# Patient Record
Sex: Female | Born: 1990 | Race: Black or African American | Hispanic: No | Marital: Single | Smoking: Never smoker
Health system: Southern US, Community
[De-identification: ages and names within clinical notes are randomized; demographics above are authoritative.]

---

## 2009-11-25 ENCOUNTER — Inpatient Hospital Stay (HOSPITAL_COMMUNITY): Admission: AD | Admit: 2009-11-25 | Discharge: 2009-11-25 | Payer: Self-pay | Admitting: Obstetrics and Gynecology

## 2009-11-26 ENCOUNTER — Inpatient Hospital Stay (HOSPITAL_COMMUNITY): Admission: AD | Admit: 2009-11-26 | Discharge: 2009-11-28 | Payer: Self-pay | Admitting: Obstetrics and Gynecology

## 2011-01-15 LAB — CBC
Hemoglobin: 11 g/dL — ABNORMAL LOW (ref 12.0–15.0)
Platelets: 287 10*3/uL (ref 150–400)
RBC: 3.47 MIL/uL — ABNORMAL LOW (ref 3.87–5.11)
RBC: 4.23 MIL/uL (ref 3.87–5.11)
WBC: 13.2 10*3/uL — ABNORMAL HIGH (ref 4.0–10.5)
WBC: 8.3 10*3/uL (ref 4.0–10.5)

## 2011-01-15 LAB — RPR: RPR Ser Ql: NONREACTIVE

## 2014-08-29 ENCOUNTER — Encounter (HOSPITAL_BASED_OUTPATIENT_CLINIC_OR_DEPARTMENT_OTHER): Payer: Self-pay

## 2014-08-29 ENCOUNTER — Emergency Department (HOSPITAL_BASED_OUTPATIENT_CLINIC_OR_DEPARTMENT_OTHER)
Admission: EM | Admit: 2014-08-29 | Discharge: 2014-08-29 | Disposition: A | Discharge status: Home / Self Care | Payer: 59 | Attending: Emergency Medicine | Admitting: Emergency Medicine

## 2014-08-29 DIAGNOSIS — J029 Acute pharyngitis, unspecified: Secondary | ICD-10-CM | POA: Diagnosis not present

## 2014-08-29 DIAGNOSIS — J020 Streptococcal pharyngitis: Secondary | ICD-10-CM

## 2014-08-29 LAB — RAPID STREP SCREEN (MED CTR MEBANE ONLY): STREPTOCOCCUS, GROUP A SCREEN (DIRECT): POSITIVE — AB

## 2014-08-29 MED ORDER — PENICILLIN G BENZATHINE & PROC 1200000 UNIT/2ML IM SUSP
INTRAMUSCULAR | Status: AC
  Administered 2014-08-29: 13:00:00
  Filled 2014-08-29: qty 2

## 2014-08-29 MED ORDER — HYDROCODONE-ACETAMINOPHEN 5-325 MG PO TABS
1.0000 | ORAL_TABLET | Freq: Four times a day (QID) | ORAL | Status: DC | PRN
Start: 2014-08-29 — End: 2015-08-17

## 2014-08-29 MED ORDER — PENICILLIN G BENZATHINE & PROC 900000-300000 UNIT/2ML IM SUSP
1.2000 10*6.[IU] | Freq: Once | INTRAMUSCULAR | Status: DC
  Filled 2014-08-29: qty 2

## 2014-08-29 NOTE — ED Notes (Signed)
Patient here with swelling and pain to left side of throat intermittently x 2 weeks, no other associated symptoms

## 2014-08-29 NOTE — ED Notes (Signed)
Unable to pull penicillin g benzathine-penicilln. Hope PA aware Bicillin CR 600/600 ordered

## 2014-08-29 NOTE — ED Provider Notes (Signed)
CSN: [id number]     Arrival date & time 08/29/14  [id number] History   First MD Initiated Contact with Patient 08/29/14 1159     Chief Complaint  Patient presents with   Sore Throat     (Consider location/radiation/quality/duration/timing/severity/associated sxs/prior Treatment) Patient is a 23 y.o. female presenting with pharyngitis. The history is provided by the patient.  Sore Throat This is a new problem. The current episode started 1 to 4 weeks ago. The problem occurs constantly. The problem has been gradually worsening. Associated symptoms include chills, coughing, a sore throat and swollen glands. The symptoms are aggravated by swallowing. Linda Norman has tried nothing for the symptoms.   Linda Norman is a 23 y.o. female who presents to the ED with sore throat that started over a week ago. The pain has gotten worse with swollen tonsil on the left and and gland swelling  History reviewed. No pertinent past medical history. History reviewed. No pertinent past surgical history. No family history on file. History  Substance Use Topics   Smoking status: Never Smoker    Smokeless tobacco: Not on file   Alcohol Use: Not on file   OB History    No data available     Review of Systems  Constitutional: Positive for chills.  HENT: Positive for sore throat.   Respiratory: Positive for cough.   All other systems negative    Allergies  Review of patient's allergies indicates no known allergies.  Home Medications   Prior to Admission medications   Not on File   BP 143/83 mmHg   Pulse 91   Temp(Src) 98 F (36.7 C) (Oral)   Resp 18   Wt 160 lb (72.576 kg)   SpO2 100%   LMP 08/29/2014 Physical Exam  Constitutional: Linda Norman is oriented to person, place, and time. Linda Norman appears well-developed and well-nourished. No distress.  HENT:  Head: Normocephalic.  Right Ear: Tympanic membrane normal.  Left Ear: Tympanic membrane normal.  Nose: Nose normal.  Mouth/Throat: Uvula is midline.  Oropharyngeal exudate and posterior oropharyngeal erythema present.  Eyes: Conjunctivae and EOM are normal. Pupils are equal, round, and reactive to light.  Neck: Normal range of motion. Neck supple.  Cardiovascular: Normal rate and regular rhythm.   Pulmonary/Chest: Effort normal and breath sounds normal.  Abdominal: Bowel sounds are normal. There is no tenderness.  Lymphadenopathy:    Linda Norman has cervical adenopathy (left).  Neurological: Linda Norman is alert and oriented to person, place, and time.  Skin: Skin is warm and dry.  Psychiatric: Linda Norman has a normal mood and affect. Her behavior is normal.    ED Course  Procedures (including critical care time) Labs Review Labs Reviewed  RAPID STREP SCREEN - Abnormal; Notable for the following:    Streptococcus, Group A Screen (Direct) POSITIVE (*)    All other components within normal limits    MDM  23 y.o. female with sore throat and gland swelling. Patient request injection of penicillin rather than medication PO. Will administer Bicillin 1.2 MU IM prior to d/c. Stable for discharge without difficulty swallowing. Will treat pain. Discussed with the patient and all questioned fully answered. Linda Norman will return if any problems arise.

## 2015-08-17 ENCOUNTER — Emergency Department (HOSPITAL_BASED_OUTPATIENT_CLINIC_OR_DEPARTMENT_OTHER): Payer: 59

## 2015-08-17 ENCOUNTER — Emergency Department (HOSPITAL_BASED_OUTPATIENT_CLINIC_OR_DEPARTMENT_OTHER)
Admission: EM | Admit: 2015-08-17 | Discharge: 2015-08-17 | Disposition: A | Discharge status: Home / Self Care | Payer: 59 | Attending: Emergency Medicine | Admitting: Emergency Medicine

## 2015-08-17 ENCOUNTER — Encounter (HOSPITAL_BASED_OUTPATIENT_CLINIC_OR_DEPARTMENT_OTHER): Payer: Self-pay | Admitting: *Deleted

## 2015-08-17 DIAGNOSIS — R55 Syncope and collapse: Secondary | ICD-10-CM | POA: Diagnosis not present

## 2015-08-17 DIAGNOSIS — R42 Dizziness and giddiness: Secondary | ICD-10-CM | POA: Diagnosis not present

## 2015-08-17 DIAGNOSIS — R112 Nausea with vomiting, unspecified: Secondary | ICD-10-CM | POA: Insufficient documentation

## 2015-08-17 DIAGNOSIS — R0602 Shortness of breath: Secondary | ICD-10-CM | POA: Diagnosis not present

## 2015-08-17 DIAGNOSIS — O9989 Other specified diseases and conditions complicating pregnancy, childbirth and the puerperium: Secondary | ICD-10-CM | POA: Insufficient documentation

## 2015-08-17 DIAGNOSIS — Z3490 Encounter for supervision of normal pregnancy, unspecified, unspecified trimester: Secondary | ICD-10-CM

## 2015-08-17 DIAGNOSIS — Z3A13 13 weeks gestation of pregnancy: Secondary | ICD-10-CM | POA: Diagnosis not present

## 2015-08-17 LAB — CBC WITH DIFFERENTIAL/PLATELET
Basophils Absolute: 0 10*3/uL (ref 0.0–0.1)
Basophils Relative: 0 %
EOS ABS: 0 10*3/uL (ref 0.0–0.7)
EOS PCT: 0 %
HCT: 39.3 % (ref 36.0–46.0)
Hemoglobin: 13.6 g/dL (ref 12.0–15.0)
LYMPHS ABS: 0.9 10*3/uL (ref 0.7–4.0)
LYMPHS PCT: 10 %
MCH: 31.2 pg (ref 26.0–34.0)
MCHC: 34.6 g/dL (ref 30.0–36.0)
MCV: 90.1 fL (ref 78.0–100.0)
MONO ABS: 0.5 10*3/uL (ref 0.1–1.0)
MONOS PCT: 6 %
Neutro Abs: 7.6 10*3/uL (ref 1.7–7.7)
Neutrophils Relative %: 84 %
PLATELETS: 285 10*3/uL (ref 150–400)
RBC: 4.36 MIL/uL (ref 3.87–5.11)
RDW: 13 % (ref 11.5–15.5)
WBC: 9.1 10*3/uL (ref 4.0–10.5)

## 2015-08-17 LAB — BASIC METABOLIC PANEL
Anion gap: 6 (ref 5–15)
BUN: 5 mg/dL — AB (ref 6–20)
CO2: 24 mmol/L (ref 22–32)
CREATININE: 0.54 mg/dL (ref 0.44–1.00)
Calcium: 8.8 mg/dL — ABNORMAL LOW (ref 8.9–10.3)
Chloride: 105 mmol/L (ref 101–111)
GFR calc Af Amer: >60 mL/min (ref >60)
GLUCOSE: 90 mg/dL (ref 65–99)
POTASSIUM: 3.4 mmol/L — AB (ref 3.5–5.1)
Sodium: 135 mmol/L (ref 135–145)

## 2015-08-17 LAB — HCG, QUANTITATIVE, PREGNANCY: hCG, Beta Chain, Quant, S: 45291 m[IU]/mL — ABNORMAL HIGH (ref <5)

## 2015-08-17 LAB — URINALYSIS, ROUTINE W REFLEX MICROSCOPIC
GLUCOSE, UA: NEGATIVE mg/dL
HGB URINE DIPSTICK: NEGATIVE
KETONES UR: NEGATIVE mg/dL
Leukocytes, UA: NEGATIVE
Nitrite: NEGATIVE
PH: 6 (ref 5.0–8.0)
Protein, ur: NEGATIVE mg/dL
SPECIFIC GRAVITY, URINE: 1.024 (ref 1.005–1.030)
Urobilinogen, UA: 1 mg/dL (ref 0.0–1.0)

## 2015-08-17 LAB — PREGNANCY, URINE: Preg Test, Ur: POSITIVE — AB

## 2015-08-17 NOTE — ED Notes (Signed)
Pt ambulated around nurses station x 1. Cont pulse ox on pt during ambulation. Pt hrt rate remained in upper 80's and O2 sats 98-100%. Denies any sx or SOB while walking.

## 2015-08-17 NOTE — ED Provider Notes (Signed)
CSN: [id number]     Arrival date & time 08/17/15  0758 History   First MD Initiated Contact with Patient 08/17/15 [phone]     No chief complaint on file.    (Consider location/radiation/quality/duration/timing/severity/associated sxs/prior Treatment) HPI Comments: Patient complains of "panic attack" while getting off the bus this morning. She endorsed quivering lips, shortness of breath, pain with inspiration, dizziness and lightheadedness for several minutes. This resolved after she rested in the restroom coming up against the wall. She feels back to baseline now. No history of previous episodes that are similar. No history of panic attacks. She denies any cough, fever, abdominal pain. She has had a few episodes of nausea and vomiting this morning because she is approximately 3 months pregnant last menstrual period in July. She's not had any vaginal bleeding or abdominal pain. She has not had an ultrasound this pregnancy. She denies any previous episodes of dizziness or lightheadedness. No fever. Good by mouth intake and urine output. Denies any other medical problems or any medications. No previous diagnosis of panic attacks.  The history is provided by the patient.    History reviewed. No pertinent past medical history. History reviewed. No pertinent past surgical history. No family history on file. Social History  Substance Use Topics  . Smoking status: Never Smoker   . Smokeless tobacco: None  . Alcohol Use: None   OB History    Gravida Para Term Preterm AB TAB SAB Ectopic Multiple Living   1              Review of Systems  Constitutional: Negative for fever, activity change and appetite change.  HENT: Negative for congestion and rhinorrhea.   Respiratory: Positive for shortness of breath. Negative for cough and chest tightness.   Cardiovascular: Negative for chest pain.  Gastrointestinal: Positive for nausea and vomiting. Negative for abdominal pain.  Genitourinary: Negative for  dysuria, hematuria, vaginal bleeding and vaginal discharge.  Musculoskeletal: Negative for myalgias and arthralgias.  Neurological: Positive for dizziness, weakness and light-headedness. Negative for headaches.  A complete 10 system review of systems was obtained and all systems are negative except as noted in the HPI and PMH.      Allergies  Review of patient's allergies indicates no known allergies.  Home Medications   Prior to Admission medications   Not on File   BP 118/55 mmHg  Pulse 84  Temp(Src) 98.7 F (37.1 C) (Oral)  Resp 18  Ht 5\' 7"  (1.702 m)  Wt 165 lb (74.844 kg)  BMI 25.84 kg/m2  SpO2 99%  LMP 05/18/2015 Physical Exam  Constitutional: She is oriented to person, place, and time. She appears well-developed and well-nourished. No distress.  HENT:  Head: Normocephalic and atraumatic.  Mouth/Throat: Oropharynx is clear and moist. No oropharyngeal exudate.  Eyes: Conjunctivae and EOM are normal. Pupils are equal, round, and reactive to light.  Neck: Normal range of motion. Neck supple.  No meningismus.  Cardiovascular: Normal rate, regular rhythm, normal heart sounds and intact distal pulses.   No murmur heard. Pulmonary/Chest: Effort normal and breath sounds normal. No respiratory distress. She exhibits no tenderness.  Abdominal: Soft. There is no tenderness. There is no rebound and no guarding.  Gravid abdomen, nontender  Musculoskeletal: Normal range of motion. She exhibits no edema or tenderness.  Neurological: She is alert and oriented to person, place, and time. No cranial nerve deficit. She exhibits normal muscle tone. Coordination normal.  No ataxia on finger to nose bilaterally. No pronator drift.  5/5 strength throughout. CN 2-12 intact. Negative Romberg. Equal grip strength. Sensation intact. Gait is normal.   Skin: Skin is warm.  Psychiatric: She has a normal mood and affect. Her behavior is normal.  Nursing note and vitals reviewed.   ED Course   Procedures (including critical care time) Labs Review Labs Reviewed  URINALYSIS, ROUTINE W REFLEX MICROSCOPIC (NOT AT [facility]) - Abnormal; Notable for the following:    Color, Urine AMBER (*)    APPearance CLOUDY (*)    Bilirubin Urine SMALL (*)    All other components within normal limits  PREGNANCY, URINE - Abnormal; Notable for the following:    Preg Test, Ur POSITIVE (*)    All other components within normal limits  BASIC METABOLIC PANEL - Abnormal; Notable for the following:    Potassium 3.4 (*)    BUN 5 (*)    Calcium 8.8 (*)    All other components within normal limits  HCG, QUANTITATIVE, PREGNANCY - Abnormal; Notable for the following:    hCG, Beta Chain, Quant, S 45291 (*)    All other components within normal limits  CBC WITH DIFFERENTIAL/PLATELET    Imaging Review US Ob Comp Less 14 Wks  08/17/2015  CLINICAL DATA:  Syncope. Dizziness and lightheadedness this morning. Early pregnancy. EXAM: OBSTETRIC <14 WK ULTRASOUND TECHNIQUE: Transabdominal ultrasound was performed for evaluation of the gestation as well as the maternal uterus and adnexal regions. COMPARISON:  None. FINDINGS: Intrauterine gestational sac: Visualized/normal in shape. Yolk sac:  No longer visualized. Embryo:  Present. Cardiac Activity: Present. Heart Rate: 152 bpm CRL:   75  mm   13 w 4 d                  [place] EDC: 02/18/2016 Maternal uterus/adnexae: The ovaries not identified. No free fluid is seen in the pelvis. IMPRESSION: Single living intrauterine gestation as above. Gestational age [redacted] weeks 4 days by ultrasound. Electronically Signed   By: Sebastian Ache M.D.   On: 08/17/2015 09:41   I have personally reviewed and evaluated these images and lab results as part of my medical decision-making.   EKG Interpretation None      MDM   Final diagnoses:  Near syncope  Pregnant   Episode of dizziness with shortness of breath, chest tightness, quivering lips, nausea. Approximately [redacted] weeks pregnant with no  prenatal care. Episode now resolved. No abdominal pain or vaginal bleeding. No previous history of panic attacks.  EKG normal sinus. No hypoxia, no tachycardia. Doubt pulmonary embolism. Orthostatics negative.  Labs without anemia. Electrolytes stable. Ultrasound confirms IUP at [redacted] weeks gestation. No complicating features.  Tolerating by mouth and ambulatory. No hypoxia with ambulation. Patient feels better. No chest pain or shortness of breath. Discussed that my suspicion for pulmonary embolism is low, patient agrees and she defers CT angiogram at this time. Return precautions discussed.      ED ECG REPORT   Date: 08/17/2015  Rate: 85  Rhythm: normal sinus rhythm  QRS Axis: normal  Intervals: normal  ST/T Wave abnormalities: normal  Conduction Disutrbances:none  Narrative Interpretation:   Old EKG Reviewed: none available  I have personally reviewed the EKG tracing and agree with the computerized printout as noted.   Glynn Octave, MD 08/17/15 1536

## 2015-08-17 NOTE — Discharge Instructions (Signed)
Near-Syncope Keep yourself hydrated. Follow up with your obstetrician. Return to the ED if you develop chest pain, shortness of breath or any other concerns. Near-syncope (commonly known as near fainting) is sudden weakness, dizziness, or feeling like you might pass out. During an episode of near-syncope, you may also develop pale skin, have tunnel vision, or feel sick to your stomach (nauseous). Near-syncope may occur when getting up after sitting or while standing for a long time. It is caused by a sudden decrease in blood flow to the brain. This decrease can result from various causes or triggers, most of which are not serious. However, because near-syncope can sometimes be a sign of something serious, a medical evaluation is required. The specific cause is often not determined. HOME CARE INSTRUCTIONS  Monitor your condition for any changes. The following actions may help to alleviate any discomfort you are experiencing:  Have someone stay with you until you feel stable.  Lie down right away and prop your feet up if you start feeling like you might faint. Breathe deeply and steadily. Wait until all the symptoms have passed. Most of these episodes last only a few minutes. You may feel tired for several hours.   Drink enough fluids to keep your urine clear or pale yellow.   If you are taking blood pressure or heart medicine, get up slowly when seated or lying down. Take several minutes to sit and then stand. This can reduce dizziness.  Follow up with your health care provider as directed. SEEK IMMEDIATE MEDICAL CARE IF:   You have a severe headache.   You have unusual pain in the chest, abdomen, or back.   You are bleeding from the mouth or rectum, or you have black or tarry stool.   You have an irregular or very fast heartbeat.   You have repeated fainting or have seizure-like jerking during an episode.   You faint when sitting or lying down.   You have confusion.   You  have difficulty walking.   You have severe weakness.   You have vision problems.  MAKE SURE YOU:   Understand these instructions.  Will watch your condition.  Will get help right away if you are not doing well or get worse.   This information is not intended to replace advice given to you by your health care provider. Make sure you discuss any questions you have with your health care provider.   Document Released: 10/15/2005 Document Revised: 10/20/2013 Document Reviewed: 03/20/2013 Elsevier Interactive Patient Education [employer]2016 Elsevier Inc.

## 2015-08-17 NOTE — ED Notes (Signed)
Pt given cranberry juice and water at her request for po challenge.

## 2015-08-17 NOTE — ED Notes (Signed)
C/o sob and pain with breathing with dizziness. C/o left sided c/p with deep breaths. States feels like panic attack.

## 2015-08-23 LAB — OB RESULTS CONSOLE ABO/RH: RH TYPE: POSITIVE

## 2015-08-23 LAB — OB RESULTS CONSOLE ANTIBODY SCREEN: Antibody Screen: NEGATIVE

## 2015-08-23 LAB — OB RESULTS CONSOLE RPR: RPR: NONREACTIVE

## 2015-08-23 LAB — OB RESULTS CONSOLE HEPATITIS B SURFACE ANTIGEN: Hepatitis B Surface Ag: NEGATIVE

## 2015-08-23 LAB — OB RESULTS CONSOLE HIV ANTIBODY (ROUTINE TESTING): HIV: NONREACTIVE

## 2015-08-23 LAB — OB RESULTS CONSOLE RUBELLA ANTIBODY, IGM: Rubella: IMMUNE

## 2015-10-30 NOTE — L&D Delivery Note (Signed)
Final Labor Progress Note At 0757 pt reports an increased in rectal pressure. FHR remained reassuring   Vaginal Delivery Note The pt utilized an epidural as pain management.   Spontaneous rupture of membranes today, at 0715, clear.  GBS negative.  Cervical dilation was complete at  0804.  NICHD Category 2.    Pushing with guidance began at  0807.   After 8 minutes of pushing the head, shoulders and the body of a viable female infant "Linda Norman" delivered spontaneously with maternal effort EnCaul in the direct ROP position at 0815.  Loose Corson x 3, infant somersaulted thru without difficulty d/t rapid delivery.   With vigorous tone and spontaneous cry, the infant was placed on moms abd.  After the umbilical cord was clamped it was cut by the FOB, then cord blood was obtained for evaluation.  Spontaneous delivery of a intact placenta with a 3 vessel cord via Shultz at  [phone].   Episiotomy: None   The vulva, perineum, vaginal vault, rectum and cervix were inspected no repairs needed.  Postpartum pitocin as ordered.  Fundus firm, lochia minimum, bleeding under control. EBL 100, Pt hemodynamically stable.   Sponge, laps and needle count correct and verified with the primary care nurse.  Attending MD available at all times.    Routine postpartum orders   Mother unsure about method of contraception  Mom plans to breastfeed   Infant to have out patient circumcision   Placenta to pathology: NO     Cord Gases sent to lab: NO Cord blood sent to lab: YES   APGARS:  9 at 1 minute and 9 at 5 minutes Weight:. pending     Both mom and baby were left in stable condition, baby skin to skin.      Linda Norman, CNM, MSN 02/15/2016. 8:41 AM

## 2015-11-10 LAB — OB RESULTS CONSOLE GC/CHLAMYDIA
Chlamydia: NEGATIVE
Gonorrhea: NEGATIVE

## 2016-01-24 LAB — OB RESULTS CONSOLE GBS: STREP GROUP B AG: NEGATIVE

## 2016-02-15 ENCOUNTER — Inpatient Hospital Stay (HOSPITAL_COMMUNITY)
Admission: AD | Admit: 2016-02-15 | Discharge: 2016-02-16 | DRG: 775 | Disposition: A | Discharge status: Home / Self Care | Payer: Medicaid Other | Source: Ambulatory Visit | Attending: Obstetrics and Gynecology | Admitting: Obstetrics and Gynecology

## 2016-02-15 ENCOUNTER — Encounter (HOSPITAL_COMMUNITY): Payer: Self-pay

## 2016-02-15 ENCOUNTER — Inpatient Hospital Stay (HOSPITAL_COMMUNITY): Payer: Medicaid Other | Admitting: Anesthesiology

## 2016-02-15 DIAGNOSIS — O2243 Hemorrhoids in pregnancy, third trimester: Secondary | ICD-10-CM | POA: Diagnosis present

## 2016-02-15 DIAGNOSIS — Z3A39 39 weeks gestation of pregnancy: Secondary | ICD-10-CM

## 2016-02-15 LAB — CBC
HCT: 38.9 % (ref 36.0–46.0)
Hemoglobin: 13.8 g/dL (ref 12.0–15.0)
MCH: 32.2 pg (ref 26.0–34.0)
MCHC: 35.5 g/dL (ref 30.0–36.0)
MCV: 90.9 fL (ref 78.0–100.0)
PLATELETS: 263 10*3/uL (ref 150–400)
RBC: 4.28 MIL/uL (ref 3.87–5.11)
RDW: 13.7 % (ref 11.5–15.5)
WBC: 11.2 10*3/uL — ABNORMAL HIGH (ref 4.0–10.5)

## 2016-02-15 LAB — RPR: RPR: NONREACTIVE

## 2016-02-15 MED ORDER — DIPHENHYDRAMINE HCL 25 MG PO CAPS: 25.0000 mg | ORAL_CAPSULE | Freq: Four times a day (QID) | ORAL | Status: DC | PRN

## 2016-02-15 MED ORDER — OXYCODONE HCL 5 MG PO TABS
5.0000 mg | ORAL_TABLET | Freq: Once | ORAL | Status: AC
  Administered 2016-02-15: 5 mg via ORAL
  Filled 2016-02-15: qty 1

## 2016-02-15 MED ORDER — LIDOCAINE HCL (PF) 1 % IJ SOLN
INTRAMUSCULAR | Status: DC | PRN
Start: 2016-02-15 — End: 2016-02-15
  Administered 2016-02-15 (×2): 5 mL

## 2016-02-15 MED ORDER — ONDANSETRON HCL 4 MG/2ML IJ SOLN: 4.0000 mg | INTRAMUSCULAR | Status: DC | PRN

## 2016-02-15 MED ORDER — CITRIC ACID-SODIUM CITRATE 334-500 MG/5ML PO SOLN: 30.0000 mL | ORAL | Status: DC | PRN

## 2016-02-15 MED ORDER — EPHEDRINE 5 MG/ML INJ
10.0000 mg | INTRAVENOUS | Status: DC | PRN
  Filled 2016-02-15: qty 2

## 2016-02-15 MED ORDER — OXYCODONE-ACETAMINOPHEN 5-325 MG PO TABS: 2.0000 | ORAL_TABLET | ORAL | Status: DC | PRN

## 2016-02-15 MED ORDER — FENTANYL 2.5 MCG/ML BUPIVACAINE 1/10 % EPIDURAL INFUSION (WH - ANES)
14.0000 mL/h | INTRAMUSCULAR | Status: DC | PRN
  Administered 2016-02-15: 14 mL/h via EPIDURAL
  Filled 2016-02-15: qty 125

## 2016-02-15 MED ORDER — PHENYLEPHRINE 40 MCG/ML (10ML) SYRINGE FOR IV PUSH (FOR BLOOD PRESSURE SUPPORT)
80.0000 ug | PREFILLED_SYRINGE | INTRAVENOUS | Status: DC | PRN
  Filled 2016-02-15: qty 2
  Filled 2016-02-15: qty 20

## 2016-02-15 MED ORDER — WITCH HAZEL-GLYCERIN EX PADS: 1.0000 "application " | MEDICATED_PAD | CUTANEOUS | Status: DC | PRN

## 2016-02-15 MED ORDER — LIDOCAINE HCL (PF) 1 % IJ SOLN
30.0000 mL | INTRAMUSCULAR | Status: DC | PRN
  Filled 2016-02-15: qty 30

## 2016-02-15 MED ORDER — SIMETHICONE 80 MG PO CHEW: 80.0000 mg | CHEWABLE_TABLET | ORAL | Status: DC | PRN

## 2016-02-15 MED ORDER — FENTANYL CITRATE (PF) 100 MCG/2ML IJ SOLN: 100.0000 ug | Freq: Once | INTRAMUSCULAR | Status: DC

## 2016-02-15 MED ORDER — COCONUT OIL OIL: 1.0000 "application " | TOPICAL_OIL | Status: DC | PRN

## 2016-02-15 MED ORDER — FLEET ENEMA 7-19 GM/118ML RE ENEM: 1.0000 | ENEMA | RECTAL | Status: DC | PRN

## 2016-02-15 MED ORDER — DIBUCAINE 1 % RE OINT: 1.0000 "application " | TOPICAL_OINTMENT | RECTAL | Status: DC | PRN

## 2016-02-15 MED ORDER — OXYTOCIN BOLUS FROM INFUSION
500.0000 mL | INTRAVENOUS | Status: DC
  Administered 2016-02-15: 500 mL via INTRAVENOUS

## 2016-02-15 MED ORDER — ACETAMINOPHEN 325 MG PO TABS
650.0000 mg | ORAL_TABLET | ORAL | Status: DC | PRN
  Administered 2016-02-15 – 2016-02-16 (×2): 650 mg via ORAL
  Filled 2016-02-15 (×2): qty 2

## 2016-02-15 MED ORDER — ONDANSETRON HCL 4 MG/2ML IJ SOLN
4.0000 mg | Freq: Four times a day (QID) | INTRAMUSCULAR | Status: DC | PRN
  Administered 2016-02-15: 4 mg via INTRAVENOUS
  Filled 2016-02-15: qty 2

## 2016-02-15 MED ORDER — IBUPROFEN 600 MG PO TABS
600.0000 mg | ORAL_TABLET | Freq: Four times a day (QID) | ORAL | Status: DC
  Administered 2016-02-15 – 2016-02-16 (×4): 600 mg via ORAL
  Filled 2016-02-15 (×4): qty 1

## 2016-02-15 MED ORDER — LACTATED RINGERS IV SOLN
INTRAVENOUS | Status: DC
  Administered 2016-02-15: 05:00:00 via INTRAVENOUS

## 2016-02-15 MED ORDER — OXYCODONE-ACETAMINOPHEN 5-325 MG PO TABS: 1.0000 | ORAL_TABLET | ORAL | Status: DC | PRN

## 2016-02-15 MED ORDER — ZOLPIDEM TARTRATE 5 MG PO TABS: 5.0000 mg | ORAL_TABLET | Freq: Every evening | ORAL | Status: DC | PRN

## 2016-02-15 MED ORDER — LACTATED RINGERS IV SOLN
500.0000 mL | Freq: Once | INTRAVENOUS | Status: AC
  Administered 2016-02-15: 500 mL via INTRAVENOUS

## 2016-02-15 MED ORDER — PRENATAL MULTIVITAMIN CH
1.0000 | ORAL_TABLET | Freq: Every day | ORAL | Status: DC
  Administered 2016-02-15 – 2016-02-16 (×2): 1 via ORAL
  Filled 2016-02-15 (×2): qty 1

## 2016-02-15 MED ORDER — BENZOCAINE-MENTHOL 20-0.5 % EX AERO: 1.0000 "application " | INHALATION_SPRAY | CUTANEOUS | Status: DC | PRN

## 2016-02-15 MED ORDER — TETANUS-DIPHTH-ACELL PERTUSSIS 5-2.5-18.5 LF-MCG/0.5 IM SUSP: 0.5000 mL | Freq: Once | INTRAMUSCULAR | Status: DC

## 2016-02-15 MED ORDER — LACTATED RINGERS IV SOLN: 500.0000 mL | INTRAVENOUS | Status: DC | PRN

## 2016-02-15 MED ORDER — LACTATED RINGERS IV SOLN
2.5000 [IU]/h | INTRAVENOUS | Status: DC
  Filled 2016-02-15: qty 4

## 2016-02-15 MED ORDER — ONDANSETRON HCL 4 MG PO TABS: 4.0000 mg | ORAL_TABLET | ORAL | Status: DC | PRN

## 2016-02-15 MED ORDER — ACETAMINOPHEN 325 MG PO TABS: 650.0000 mg | ORAL_TABLET | ORAL | Status: DC | PRN

## 2016-02-15 MED ORDER — PHENYLEPHRINE 40 MCG/ML (10ML) SYRINGE FOR IV PUSH (FOR BLOOD PRESSURE SUPPORT)
80.0000 ug | PREFILLED_SYRINGE | INTRAVENOUS | Status: DC | PRN
  Filled 2016-02-15: qty 2

## 2016-02-15 MED ORDER — FENTANYL CITRATE (PF) 100 MCG/2ML IJ SOLN
50.0000 ug | INTRAMUSCULAR | Status: DC | PRN
  Administered 2016-02-15: 100 ug via INTRAVENOUS
  Filled 2016-02-15: qty 2

## 2016-02-15 MED ORDER — SENNOSIDES-DOCUSATE SODIUM 8.6-50 MG PO TABS
2.0000 | ORAL_TABLET | ORAL | Status: DC
  Administered 2016-02-15: 2 via ORAL
  Filled 2016-02-15: qty 2

## 2016-02-15 MED ORDER — DIPHENHYDRAMINE HCL 50 MG/ML IJ SOLN: 12.5000 mg | INTRAMUSCULAR | Status: DC | PRN

## 2016-02-15 NOTE — MAU Note (Signed)
Pt here with contractions and leaking fluid. Was 3 cm last week in office. Denies any problems with the pregnancy. GBS negative. Reports positive fetal movement

## 2016-02-15 NOTE — Anesthesia Postprocedure Evaluation (Signed)
Anesthesia Post Note  Patient: Linda Norman  Procedure(s) Performed: * No procedures listed *  Patient location during evaluation: Mother Baby Anesthesia Type: Epidural Level of consciousness: awake, awake and alert, oriented and patient cooperative Pain management: pain level controlled Vital Signs Assessment: post-procedure vital signs reviewed and stable Respiratory status: spontaneous breathing, respiratory function stable and nonlabored ventilation Cardiovascular status: stable Postop Assessment: no headache, no backache, patient able to bend at knees and no signs of nausea or vomiting Anesthetic complications: no    Last Vitals:  Filed Vitals:   02/15/16 1100 02/15/16 1424  BP: 128/69 111/54  Pulse: 71 97  Temp: 36.7 C 36.6 C  Resp: 18 18    Last Pain:  Filed Vitals:   02/15/16 1510  PainSc: 3                  Burnis Halling L

## 2016-02-15 NOTE — Anesthesia Procedure Notes (Signed)
Epidural Patient location during procedure: OB  Staffing Anesthesiologist: Maven Varelas Performed by: anesthesiologist   Preanesthetic Checklist Completed: patient identified, site marked, surgical consent, pre-op evaluation, timeout performed, IV checked, risks and benefits discussed and monitors and equipment checked  Epidural Patient position: sitting Prep: DuraPrep Patient monitoring: heart rate, continuous pulse ox and blood pressure Approach: midline Location: L3-L4 Injection technique: LOR saline  Needle:  Needle type: Tuohy  Needle gauge: 17 G Needle length: 9 cm and 9 Needle insertion depth: 5 cm Catheter type: closed end flexible Catheter size: 20 Guage Catheter at skin depth: 9 cm Test dose: negative  Assessment Events: blood not aspirated, injection not painful, no injection resistance, negative IV test and no paresthesia  Additional Notes Patient identified. Risks/Benefits/Options discussed with patient including but not limited to bleeding, infection, nerve damage, paralysis, failed block, incomplete pain control, headache, blood pressure changes, nausea, vomiting, reactions to medication both or allergic, itching and postpartum back pain. Confirmed with bedside nurse the patient's most recent platelet count. Confirmed with patient that they are not currently taking any anticoagulation, have any bleeding history or any family history of bleeding disorders. Patient expressed understanding and wished to proceed. All questions were answered. Sterile technique was used throughout the entire procedure. Please see nursing notes for vital signs. Test dose was given through epidural needle and negative prior to continuing to dose epidural or start infusion. Warning signs of high block given to the patient including shortness of breath, tingling/numbness in hands, complete motor block, or any concerning symptoms with instructions to call for help. Patient was given  instructions on fall risk and not to get out of bed. All questions and concerns addressed with instructions to call with any issues.   

## 2016-02-15 NOTE — Lactation Note (Signed)
This note was copied from a baby's chart. Lactation Consultation Note  Patient Name: Boy Homero FellersShenequa Blaydes ZOXWR'UToday's Date: 02/15/2016 Reason for consult: Initial assessment Baby at 9 hr of life. Experienced bf mom reports latching is going well. She bf her older child "over a year" with no issues. Discussed baby behavior, feeding frequency, baby belly size, voids, wt loss, breast changes, and nipple care. Mom stated she can manually express, has seen colostrum, and has a spoon in the room. Given lactation handouts. Aware of OP services and support group. She will call as needed for bf support.      Maternal Data    Feeding Feeding Type: Breast Fed Length of feed: 20 min  LATCH Score/Interventions                      Lactation Tools Discussed/Used WIC Program: Yes   Consult Status Consult Status: PRN    Rulon Eisenmengerlizabeth E Xayvion Shirah 02/15/2016, 6:03 PM

## 2016-02-15 NOTE — H&P (Signed)
Linda Norman is a 25 y.o. female, G2P1001 at 39 weeks, presenting to MAU after calling w/ c/o UCs and feeling pressure since early am. Endorses FM. Denies VB or LOF. Cervix was 3/60/-1 on 02/08/16. Screaming in pain on arrival.   Patient Active Problem List   Diagnosis Date Noted  . Normal labor 02/15/2016   History of present pregnancy: Patient entered care at 14.6 weeks.   EDC of 02/22/16 was established by LMP.   Anatomy scan: 21.4 weeks, with normal findings and an anterior placenta.   Additional US evaluations: 27.6 wks (spotting): SIUP, normal fluid, cvx 4.32 cm, no sign of abruption, cvx closed/long.    Significant prenatal events: +CT at NOB; TOC neg. Vomited pills initially - given Phenergan po - able to keep down second round. Received flu shot on 08/30/15 and Tdap on 11/29/15. FOB involved. Spotting at 27+ wks; u/s = no signs of abruption. Hemorrhoidal pain - OTC Preparation H recommended. No MAU visits. TWG 15.5 lbs.      Last evaluation: Office by Dr. Su Hiltoberts at 38 wks. FHR 152 bpm. Cvx 3/60/-1. BP 110/60. Wt: 184 lbs. Trace edema.  OB History    Gravida Para Term Preterm AB TAB SAB Ectopic Multiple Living   2 1        0     SVB 11/26/2009 @ 40 wks, female infant, birthwt 8+1   No past medical history on file. No past surgical history on file. Family History: family history is not on file. Social History:  reports that she has never smoked. She does not have any smokeless tobacco history on file. Her alcohol and drug histories are not on file. Patient is single, with FOB Jill Alexanders(Justin McCaulley-Alston) involved and supportive.She is [place], of the [employer] faith, employed as a LawyerCNA, and high-school educated. She will accept blood in an emergency.    Prenatal Transfer Tool  Maternal Diabetes: No Genetic Screening: Declined Maternal Ultrasounds/Referrals: Normal Fetal Ultrasounds or other Referrals:  None Maternal Substance Abuse:  No Significant Maternal  Medications:  Meds include: Other: Promethazine 12.5 mg tablet prn, PNV Significant Maternal Lab Results: Lab values include: Group B Strep negative  TDAP: 11/29/15 Flu: 08/30/15  ROS:10 Systems reviewed and are negative for acute change except as noted in the HPI.   No Known Allergies    Blood pressure 134/85, pulse 108, temperature 98 F (36.7 C), temperature source Oral, resp. rate 15, height 5\' 7"  (1.702 m), weight 83.462 kg (184 lb), last menstrual period 05/18/2015.  Chest clear Heart RRR without murmur Abd gravid, NT, FH CWD Pelvic: 4/90/-2, BBOW EFW: 8 lbs; pelvis proven to 8+1 Cephalic by Leopolds and VE Ext: 2+ DTRs, no clonus, trace edema  FHR: BL 125 w/ moderate variability, +accels, variables, lates UCs: q 3-4 min, palpate mod  Prenatal labs: ABO, Rh: A/Positive/-- (10/25 0000) Antibody: Negative (10/25 0000) Rubella: Immune RPR: Nonreactive (10/25 0000)  HBsAg: Negative (10/25 0000)  HIV: Non-reactive (10/25 0000)  GBS: Negative (03/28 0000) Sickle cell/Hgb electrophoresis: Normal study Pap: Normal (05/18/15) GC: Neg Chlamydia: Pos 08/22/16; TOC Neg 11/10/15 Genetic screenings: Declined Glucola: Normal at 90  Hgb 12.9 at NOB, 12.6 at 28 weeks   Assessment: IUP at 39.0 wks Latent labor  Overall reassuring FHRT - intrauterine resuscitative measures in place GBS neg  Plan: Admit to Birthing Suite Routine [facility] orders Pain med/epidural prn Continue intrauterine resuscitative measures prn Dr. Charlotta Newtonzan updated Expect progress and SVD  Sherre ScarletKimberly Tasmin Exantus, CNM 02/15/2016, 06:30 AM

## 2016-02-15 NOTE — Anesthesia Preprocedure Evaluation (Signed)

## 2016-02-16 LAB — CBC
HEMATOCRIT: 33.2 % — AB (ref 36.0–46.0)
Hemoglobin: 11.5 g/dL — ABNORMAL LOW (ref 12.0–15.0)
MCH: 32.2 pg (ref 26.0–34.0)
MCHC: 34.6 g/dL (ref 30.0–36.0)
MCV: 93 fL (ref 78.0–100.0)
Platelets: 208 10*3/uL (ref 150–400)
RBC: 3.57 MIL/uL — ABNORMAL LOW (ref 3.87–5.11)
RDW: 13.9 % (ref 11.5–15.5)
WBC: 13 10*3/uL — ABNORMAL HIGH (ref 4.0–10.5)

## 2016-02-16 MED ORDER — IBUPROFEN 600 MG PO TABS: 600.0000 mg | ORAL_TABLET | Freq: Four times a day (QID) | ORAL | Status: AC | PRN

## 2016-02-16 MED ORDER — NORETHINDRONE 0.35 MG PO TABS: 1.0000 | ORAL_TABLET | Freq: Every day | ORAL | Status: AC

## 2016-02-16 NOTE — Discharge Summary (Signed)
OB Discharge Summary     Patient Name: Linda Norman DOB: 08-16-1991 MRN: [id number]  Date of admission: 02/15/2016 Delivering MD: Adelina Mings   Date of discharge: 02/16/2016  Admitting diagnosis: 40 WEEKS CTX Intrauterine pregnancy: [redacted]w[redacted]d     Secondary diagnosis:  Principal Problem:   Normal vaginal delivery Active Problems:   Normal labor  Additional problems: NONE     Discharge diagnosis: Term Pregnancy Delivered                                                                                                Post partum procedures:None  Augmentation: none  Complications: None  Hospital course:  Onset of Labor With Vaginal Delivery     25 y.o. yo G2P1001 at [redacted]w[redacted]d was admitted in Latent Labor on 02/15/2016. Patient had an uncomplicated labor course as follows:  Membrane Rupture Time/Date: 7:15 AM ,02/15/2016   Intrapartum Procedures: Episiotomy: None [1]                                         Lacerations:  None [1]  Patient had a delivery of a Viable infant. 02/15/2016  Information for the patient's newborn:  Capricia, Serda [[id number]]  Delivery Method: Vaginal, Spontaneous Delivery (Filed from Delivery Summary)    Pateint had an uncomplicated postpartum course.  She is ambulating, tolerating a regular diet, passing flatus, and urinating well. Patient is discharged home in stable condition on 02/16/2016.    Physical exam  Filed Vitals:   02/15/16 1424 02/15/16 1811 02/15/16 2130 02/16/16 0630  BP: 111/54 127/65 112/60 109/62  Pulse: 97 83 78 77  Temp: 97.8 F (36.6 C) 98.2 F (36.8 C) 97.5 F (36.4 C) 98 F (36.7 C)  TempSrc: Axillary Oral Oral Oral  Resp: Height:      Weight:      SpO2:    98%   General: alert and cooperative Lochia: appropriate Uterine Fundus: firm Laceration: Healing well with no significant drainage DVT Evaluation: No evidence of DVT seen on physical exam. Labs: Lab Results  Component Value Date   WBC  13.0* 02/16/2016   HGB 11.5* 02/16/2016   HCT 33.2* 02/16/2016   MCV 93.0 02/16/2016   PLT 208 02/16/2016   CMP Latest Ref Rng 08/17/2015  Glucose 65 - 99 mg/dL 90  BUN 6 - 20 mg/dL 5(L)  Creatinine 7.82 - 1.00 mg/dL 9.56  Sodium 213 - 086 mmol/L 135  Potassium 3.5 - 5.1 mmol/L 3.4(L)  Chloride 101 - 111 mmol/L 105  CO2 22 - 32 mmol/L 24  Calcium 8.9 - 10.3 mg/dL 5.7(Q)    Discharge instruction: per After Visit Summary and "Baby and Me Booklet".  After visit meds:    Medication List    STOP taking these medications        OVER THE COUNTER MEDICATION      TAKE these medications        ibuprofen 600 MG tablet  Commonly known as:  ADVIL,MOTRIN  Take 1 tablet (600 mg total) by mouth every 6 (six) hours as needed.     norethindrone 0.35 MG tablet  Commonly known as:  ORTHO MICRONOR  Take 1 tablet (0.35 mg total) by mouth daily.  Start taking on:  03/08/2016     prenatal multivitamin Tabs tablet  Take 1 tablet by mouth daily at 12 noon.        Diet: routine diet  Activity: Advance as tolerated. Pelvic rest for 6 weeks.  Care for postpartum vaginal delivery, breastfeeding, [place] Rich diet, Baby Blues, and Micronor instructions included.   Outpatient follow up:6 weeks Follow up Appt:No future appointments. Follow up Visit:No Follow-up on file.  Postpartum contraception: Progesterone only pills  Newborn Data: Live born female  Birth Weight: 7 lb 6.5 oz (3359 g) APGAR: 9, 9  Baby Feeding: Breast Disposition:home with mother   02/16/2016 Alphonzo Severanceachel Raveena Hebdon, CNM

## 2016-02-16 NOTE — Lactation Note (Signed)
This note was copied from a baby's chart. Lactation Consultation Note  Mom states feedings are going well.  Denies questions or concerns at present.  Lactation outpatient services and support information reviewed and encouraged.  Patient Name: Linda Norman FellersShenequa Fizer [id number]'s Date: 02/16/2016     Maternal Data    Feeding Feeding Type: Breast Fed  LATCH Score/Interventions Latch: Grasps breast easily, tongue down, lips flanged, rhythmical sucking.  Audible Swallowing: A few with stimulation  Type of Nipple: Everted at rest and after stimulation  Comfort (Breast/Nipple): Soft / non-tender     Hold (Positioning): No assistance needed to correctly position infant at breast.  LATCH Score: 9  Lactation Tools Discussed/Used     Consult Status      Huston FoleyMOULDEN, Morgen Linebaugh S 02/16/2016, 11:34 AM

## 2016-02-16 NOTE — Discharge Instructions (Signed)
Postpartum Care After Vaginal Delivery °After you deliver your newborn (postpartum period), the usual stay in the hospital is 24-72 hours. If there were problems with your labor or delivery, or if you have other medical problems, you might be in the hospital longer.  °While you are in the hospital, you will receive help and instructions on how to care for yourself and your newborn during the postpartum period.  °While you are in the hospital: °· Be sure to tell your nurses if you have pain or discomfort, as well as where you feel the pain and what makes the pain worse. °· If you had an incision made near your vagina (episiotomy) or if you had some tearing during delivery, the nurses may put ice packs on your episiotomy or tear. The ice packs may help to reduce the pain and swelling. °· If you are breastfeeding, you may feel uncomfortable contractions of your uterus for a couple of weeks. This is normal. The contractions help your uterus get back to normal size. °· It is normal to have some bleeding after delivery. °· For the first 1-3 days after delivery, the flow is red and the amount may be similar to a period. °· It is common for the flow to start and stop. °· In the first few days, you may pass some small clots. Let your nurses know if you begin to pass large clots or your flow increases. °· Do not  flush blood clots down the toilet before having the nurse look at them. °· During the next 3-10 days after delivery, your flow should become more watery and pink or brown-tinged in color. °· Ten to fourteen days after delivery, your flow should be a small amount of yellowish-white discharge. °· The amount of your flow will decrease over the first few weeks after delivery. Your flow may stop in 6-8 weeks. Most women have had their flow stop by 12 weeks after delivery. °· You should change your sanitary pads frequently. °· Wash your hands thoroughly with soap and water for at least 20 seconds after changing pads, using  the toilet, or before holding or feeding your newborn. °· You should feel like you need to empty your bladder within the first 6-8 hours after delivery. °· In case you become weak, lightheaded, or faint, call your nurse before you get out of bed for the first time and before you take a shower for the first time. °· Within the first few days after delivery, your breasts may begin to feel tender and full. This is called engorgement. Breast tenderness usually goes away within 48-72 hours after engorgement occurs. You may also notice milk leaking from your breasts. If you are not breastfeeding, do not stimulate your breasts. Breast stimulation can make your breasts produce more milk. °· Spending as much time as possible with your newborn is very important. During this time, you and your newborn can feel close and get to know each other. Having your newborn stay in your room (rooming in) will help to strengthen the bond with your newborn.  It will give you time to get to know your newborn and become comfortable caring for your newborn. °· Your hormones change after delivery. Sometimes the hormone changes can temporarily cause you to feel sad or tearful. These feelings should not last more than a few days. If these feelings last longer than that, you should talk to your caregiver. °· If desired, talk to your caregiver about methods of family planning or contraception. °·   Talk to your caregiver about immunizations. Your caregiver may want you to have the following immunizations before leaving the hospital:  Tetanus, diphtheria, and pertussis (Tdap) or tetanus and diphtheria (Td) immunization. It is very important that you and your family (including grandparents) or others caring for your newborn are up-to-date with the Tdap or Td immunizations. The Tdap or Td immunization can help protect your newborn from getting ill.  Rubella immunization.  Varicella (chickenpox) immunization.  Influenza immunization. You should  receive this annual immunization if you did not receive the immunization during your pregnancy.   This information is not intended to replace advice given to you by your health care provider. Make sure you discuss any questions you have with your health care provider.   Document Released: 08/12/2007 Document Revised: 07/09/2012 Document Reviewed: 06/11/2012 Elsevier Interactive Patient Education [employer]. Breastfeeding Deciding to breastfeed is one of the best choices you can make for you and your baby. A change in hormones during pregnancy causes your breast tissue to grow and increases the number and size of your milk ducts. These hormones also allow proteins, sugars, and fats from your blood supply to make breast milk in your milk-producing glands. Hormones prevent breast milk from being released before your baby is born as well as prompt milk flow after birth. Once breastfeeding has begun, thoughts of your baby, as well as his or her sucking or crying, can stimulate the release of milk from your milk-producing glands.  BENEFITS OF BREASTFEEDING For Your Baby  Your first milk (colostrum) helps your baby's digestive system function better.  There are antibodies in your milk that help your baby fight off infections.  Your baby has a lower incidence of asthma, allergies, and sudden infant death syndrome.  The nutrients in breast milk are better for your baby than infant formulas and are designed uniquely for your baby's needs.  Breast milk improves your baby's brain development.  Your baby is less likely to develop other conditions, such as childhood obesity, asthma, or type 2 diabetes mellitus. For You  Breastfeeding helps to create a very special bond between you and your baby.  Breastfeeding is convenient. Breast milk is always available at the correct temperature and costs nothing.  Breastfeeding helps to burn calories and helps you lose the weight gained during  pregnancy.  Breastfeeding makes your uterus contract to its prepregnancy size faster and slows bleeding (lochia) after you give birth.   Breastfeeding helps to lower your risk of developing type 2 diabetes mellitus, osteoporosis, and breast or ovarian cancer later in life. SIGNS THAT YOUR BABY IS HUNGRY Early Signs of Hunger  Increased alertness or activity.  Stretching.  Movement of the head from side to side.  Movement of the head and opening of the mouth when the corner of the mouth or cheek is stroked (rooting).  Increased sucking sounds, smacking lips, cooing, sighing, or squeaking.  Hand-to-mouth movements.  Increased sucking of fingers or hands. Late Signs of Hunger  Fussing.  Intermittent crying. Extreme Signs of Hunger Signs of extreme hunger will require calming and consoling before your baby will be able to breastfeed successfully. Do not wait for the following signs of extreme hunger to occur before you initiate breastfeeding:  Restlessness.  A loud, strong cry.  Screaming. BREASTFEEDING BASICS Breastfeeding Initiation  Find a comfortable place to sit or lie down, with your neck and back well supported.  Place a pillow or rolled up blanket under your baby to bring him or  her to the level of your breast (if you are seated). Nursing pillows are specially designed to help support your arms and your baby while you breastfeed.  Make sure that your baby's abdomen is facing your abdomen.  Gently massage your breast. With your fingertips, massage from your chest wall toward your nipple in a circular motion. This encourages milk flow. You may need to continue this action during the feeding if your milk flows slowly.  Support your breast with 4 fingers underneath and your thumb above your nipple. Make sure your fingers are well away from your nipple and your baby's mouth.  Stroke your baby's lips gently with your finger or nipple.  When your baby's mouth is open  wide enough, quickly bring your baby to your breast, placing your entire nipple and as much of the colored area around your nipple (areola) as possible into your baby's mouth.  More areola should be visible above your baby's upper lip than below the lower lip.  Your baby's tongue should be between his or her lower gum and your breast.  Ensure that your baby's mouth is correctly positioned around your nipple (latched). Your baby's lips should create a seal on your breast and be turned out (everted).  It is common for your baby to suck about 2-3 minutes in order to start the flow of breast milk. Latching Teaching your baby how to latch on to your breast properly is very important. An improper latch can cause nipple pain and decreased milk supply for you and poor weight gain in your baby. Also, if your baby is not latched onto your nipple properly, he or she may swallow some air during feeding. This can make your baby fussy. Burping your baby when you switch breasts during the feeding can help to get rid of the air. However, teaching your baby to latch on properly is still the best way to prevent fussiness from swallowing air while breastfeeding. Signs that your baby has successfully latched on to your nipple:  Silent tugging or silent sucking, without causing you pain.  Swallowing heard between every 3-4 sucks.  Muscle movement above and in front of his or her ears while sucking. Signs that your baby has not successfully latched on to nipple:  Sucking sounds or smacking sounds from your baby while breastfeeding.  Nipple pain. If you think your baby has not latched on correctly, slip your finger into the corner of your baby's mouth to break the suction and place it between your baby's gums. Attempt breastfeeding initiation again. Signs of Successful Breastfeeding Signs from your baby:  A gradual decrease in the number of sucks or complete cessation of sucking.  Falling asleep.  Relaxation  of his or her body.  Retention of a small amount of milk in his or her mouth.  Letting go of your breast by himself or herself. Signs from you:  Breasts that have increased in firmness, weight, and size 1-3 hours after feeding.  Breasts that are softer immediately after breastfeeding.  Increased milk volume, as well as a change in milk consistency and color by the fifth day of breastfeeding.  Nipples that are not sore, cracked, or bleeding. Signs That Your [employer] is Getting Enough Milk  Wetting at least 3 diapers in a 24-hour period. The urine should be clear and pale yellow by age 40 days.  At least 3 stools in a 24-hour period by age 40 days. The stool should be soft and yellow.  At least 3  stools in a 24-hour period by age 39 days. The stool should be seedy and yellow.  No loss of weight greater than 10% of birth weight during the first 43 days of age.  Average weight gain of 4-7 ounces (113-198 g) per week after age 32 days.  Consistent daily weight gain by age 68 days, without weight loss after the age of 2 weeks. After a feeding, your baby may spit up a small amount. This is common. BREASTFEEDING FREQUENCY AND DURATION Frequent feeding will help you make more milk and can prevent sore nipples and breast engorgement. Breastfeed when you feel the need to reduce the fullness of your breasts or when your baby shows signs of hunger. This is called "breastfeeding on demand." Avoid introducing a pacifier to your baby while you are working to establish breastfeeding (the first 4-6 weeks after your baby is born). After this time you may choose to use a pacifier. Research has shown that pacifier use during the first year of a baby's life decreases the risk of sudden infant death syndrome (SIDS). Allow your baby to feed on each breast as long as he or she wants. Breastfeed until your baby is finished feeding. When your baby unlatches or falls asleep while feeding from the first breast, offer the  second breast. Because newborns are often sleepy in the first few weeks of life, you may need to awaken your baby to get him or her to feed. Breastfeeding times will vary from baby to baby. However, the following rules can serve as a guide to help you ensure that your baby is properly fed:  Newborns (babies 85 weeks of age or younger) may breastfeed every 1-3 hours.  Newborns should not go longer than 3 hours during the day or 5 hours during the night without breastfeeding.  You should breastfeed your baby a minimum of 8 times in a 24-hour period until you begin to introduce solid foods to your baby at around 59 months of age. BREAST MILK PUMPING Pumping and storing breast milk allows you to ensure that your baby is exclusively fed your breast milk, even at times when you are unable to breastfeed. This is especially important if you are going back to work while you are still breastfeeding or when you are not able to be present during feedings. Your lactation consultant can give you guidelines on how long it is safe to store breast milk. A breast pump is a machine that allows you to pump milk from your breast into a sterile bottle. The pumped breast milk can then be stored in a refrigerator or freezer. Some breast pumps are operated by hand, while others use electricity. Ask your lactation consultant which type will work best for you. Breast pumps can be purchased, but some hospitals and breastfeeding support groups lease breast pumps on a monthly basis. A lactation consultant can teach you how to hand express breast milk, if you prefer not to use a pump. CARING FOR YOUR BREASTS WHILE YOU BREASTFEED Nipples can become dry, cracked, and sore while breastfeeding. The following recommendations can help keep your breasts moisturized and healthy:  Avoid using soap on your nipples.  Wear a supportive bra. Although not required, special nursing bras and tank tops are designed to allow access to your breasts  for breastfeeding without taking off your entire bra or top. Avoid wearing underwire-style bras or extremely tight bras.  Air dry your nipples for 3-58mnutes after each feeding.  Use only cotton bra pads  to absorb leaked breast milk. Leaking of breast milk between feedings is normal.  Use lanolin on your nipples after breastfeeding. Lanolin helps to maintain your skin's normal moisture barrier. If you use pure lanolin, you do not need to wash it off before feeding your baby again. Pure lanolin is not toxic to your baby. You may also hand express a few drops of breast milk and gently massage that milk into your nipples and allow the milk to air dry. In the first few weeks after giving birth, some women experience extremely full breasts (engorgement). Engorgement can make your breasts feel heavy, warm, and tender to the touch. Engorgement peaks within 3-5 days after you give birth. The following recommendations can help ease engorgement:  Completely empty your breasts while breastfeeding or pumping. You may want to start by applying warm, moist heat (in the shower or with warm water-soaked hand towels) just before feeding or pumping. This increases circulation and helps the milk flow. If your baby does not completely empty your breasts while breastfeeding, pump any extra milk after he or she is finished.  Wear a snug bra (nursing or regular) or tank top for 1-2 days to signal your body to slightly decrease milk production.  Apply ice packs to your breasts, unless this is too uncomfortable for you.  Make sure that your baby is latched on and positioned properly while breastfeeding. If engorgement persists after 48 hours of following these recommendations, contact your health care provider or a Science writer. OVERALL HEALTH CARE RECOMMENDATIONS WHILE BREASTFEEDING  Eat healthy foods. Alternate between meals and snacks, eating 3 of each per day. Because what you eat affects your breast milk,  some of the foods may make your baby more irritable than usual. Avoid eating these foods if you are sure that they are negatively affecting your baby.  Drink milk, fruit juice, and water to satisfy your thirst (about 10 glasses a day).  Rest often, relax, and continue to take your prenatal vitamins to prevent fatigue, stress, and anemia.  Continue breast self-awareness checks.  Avoid chewing and smoking tobacco. Chemicals from cigarettes that pass into breast milk and exposure to secondhand smoke may harm your baby.  Avoid alcohol and drug use, including marijuana. Some medicines that may be harmful to your baby can pass through breast milk. It is important to ask your health care provider before taking any medicine, including all over-the-counter and prescription medicine as well as vitamin and herbal supplements. It is possible to become pregnant while breastfeeding. If birth control is desired, ask your health care provider about options that will be safe for your baby. SEEK MEDICAL CARE IF:  You feel like you want to stop breastfeeding or have become frustrated with breastfeeding.  You have painful breasts or nipples.  Your nipples are cracked or bleeding.  Your breasts are red, tender, or warm.  You have a swollen area on either breast.  You have a fever or chills.  You have nausea or vomiting.  You have drainage other than breast milk from your nipples.  Your breasts do not become full before feedings by the fifth day after you give birth.  You feel sad and depressed.  Your baby is too sleepy to eat well.  Your baby is having trouble sleeping.   Your baby is wetting less than 3 diapers in a 24-hour period.  Your baby has less than 3 stools in a 24-hour period.  Your baby's skin or the white part of his  or her eyes becomes yellow.   Your baby is not gaining weight by 39 days of age. SEEK IMMEDIATE MEDICAL CARE IF:  Your baby is overly tired (lethargic) and does  not want to wake up and feed.  Your baby develops an unexplained fever.   This information is not intended to replace advice given to you by your health care provider. Make sure you discuss any questions you have with your health care provider.   Document Released: 10/15/2005 Document Revised: 07/06/2015 Document Reviewed: 04/08/2013 Elsevier Interactive Patient Education 2016 [employer]. Iron-Rich Diet Iron is a mineral that helps your body to produce hemoglobin. Hemoglobin is a protein in your red blood cells that carries oxygen to your body's tissues. Eating too little iron may cause you to feel weak and tired, and it can increase your risk for infection. Eating enough iron is necessary for your body's metabolism, muscle function, and nervous system. Iron is naturally found in many foods. It can also be added to foods or fortified in foods. There are two types of dietary iron:  Heme iron. Heme iron is absorbed by the body more easily than nonheme iron. Heme iron is found in meat, poultry, and fish.  Nonheme iron. Nonheme iron is found in dietary supplements, iron-fortified grains, beans, and vegetables. You may need to follow an iron-rich diet if:  You have been diagnosed with iron deficiency or iron-deficiency anemia.  You have a condition that prevents you from absorbing dietary iron, such as:  Infection in your intestines.  Celiac disease. This involves long-lasting (chronic) inflammation of your intestines.  You do not eat enough iron.  You eat a diet that is high in foods that impair iron absorption.  You have lost a lot of blood.  You have heavy bleeding during your menstrual cycle.  You are pregnant. WHAT IS MY PLAN? Your health care provider may help you to determine how much iron you need per day based on your condition. Generally, when a person consumes sufficient amounts of iron in the diet, the following iron needs are met:  Men.  42-67 years old: 11 mg per  day.  80-45 years old: 8 mg per day.  Women.   78-58 years old: 15 mg per day.  9-24 years old: 18 mg per day.  Over 76 years old: 8 mg per day.  Pregnant women: 27 mg per day.  Breastfeeding women: 9 mg per day. WHAT DO I NEED TO KNOW ABOUT AN IRON-RICH DIET?  Eat fresh fruits and vegetables that are high in vitamin C along with foods that are high in iron. This will help increase the amount of iron that your body absorbs from food, especially with foods containing nonheme iron. Foods that are high in vitamin C include oranges, peppers, tomatoes, and mango.  Take iron supplements only as directed by your health care provider. Overdose of iron can be life-threatening. If you were prescribed iron supplements, take them with orange juice or a vitamin C supplement.  Cook foods in pots and pans that are made from iron.   Eat nonheme iron-containing foods alongside foods that are high in heme iron. This helps to improve your iron absorption.   Certain foods and drinks contain compounds that impair iron absorption. Avoid eating these foods in the same meal as iron-rich foods or with iron supplements. These include:  Coffee, black tea, and red wine.  Milk, dairy products, and foods that are high in calcium.  Beans, soybeans, and peas.  Whole grains.  When eating foods that contain both nonheme iron and compounds that impair iron absorption, follow these tips to absorb iron better.   Soak beans overnight before cooking.  Soak whole grains overnight and drain them before using.  Ferment flours before baking, such as using yeast in bread dough. WHAT FOODS CAN I EAT? Grains Iron-fortified breakfast cereal. Iron-fortified whole-wheat bread. Enriched rice. Sprouted grains. Vegetables Spinach. Potatoes with skin. Green peas. Broccoli. Red and green bell peppers. Fermented vegetables. Fruits Prunes. Raisins. Oranges. Strawberries. Mango. Grapefruit. Meats and Other Protein  Sources Beef liver. Oysters. Beef. Shrimp. [place]. Chicken. [place]. Sardines. Chickpeas. Nuts. Tofu. Beverages Tomato juice. Fresh orange juice. Prune juice. Hibiscus tea. Fortified instant breakfast shakes. Condiments Tahini. Fermented soy sauce. Sweets and Desserts Black-strap molasses.  Other Wheat germ. The items listed above may not be a complete list of recommended foods or beverages. Contact your dietitian for more options. WHAT FOODS ARE NOT RECOMMENDED? Grains Whole grains. Bran cereal. Bran flour. Oats. Vegetables Artichokes. Brussels sprouts. Kale. Fruits Blueberries. Raspberries. Strawberries. Figs. Meats and Other Protein Sources Soybeans. Products made from soy protein. Dairy Milk. Cream. Cheese. Yogurt. Cottage cheese. Beverages Coffee. Black tea. Red wine. Sweets and Desserts Cocoa. Chocolate. Ice cream. Other Basil. Oregano. Parsley. The items listed above may not be a complete list of foods and beverages to avoid. Contact your dietitian for more information.   This information is not intended to replace advice given to you by your health care provider. Make sure you discuss any questions you have with your health care provider.   Document Released: 05/29/2005 Document Revised: 11/05/2014 Document Reviewed: 05/12/2014 Elsevier Interactive Patient Education 2016 [employer]. Postpartum Depression and Baby Blues The postpartum period begins right after the birth of a baby. During this time, there is often a great amount of joy and excitement. It is also a time of many changes in the life of the parents. Regardless of how many times a mother gives birth, each child brings new challenges and dynamics to the family. It is not unusual to have feelings of excitement along with confusing shifts in moods, emotions, and thoughts. All mothers are at risk of developing postpartum depression or the "baby blues." These mood changes can occur right after giving  birth, or they may occur many months after giving birth. The baby blues or postpartum depression can be mild or severe. Additionally, postpartum depression can go away rather quickly, or it can be a long-term condition.  CAUSES Raised hormone levels and the rapid drop in those levels are thought to be a main cause of postpartum depression and the baby blues. A number of hormones change during and after pregnancy. Estrogen and progesterone usually decrease right after the delivery of your baby. The levels of thyroid hormone and various cortisol steroids also rapidly drop. Other factors that play a role in these mood changes include major life events and genetics.  RISK FACTORS If you have any of the following risks for the baby blues or postpartum depression, know what symptoms to watch out for during the postpartum period. Risk factors that may increase the likelihood of getting the baby blues or postpartum depression include:  Having a personal or family history of depression.   Having depression while being pregnant.   Having premenstrual mood issues or mood issues related to oral contraceptives.  Having a lot of life stress.   Having marital conflict.   Lacking a social support network.   Having a baby with special  baby with special needs.   Having health problems, such as diabetes.  SIGNS AND SYMPTOMS Symptoms of baby blues include:  Brief changes in mood, such as going from extreme happiness to sadness.  Decreased concentration.   Difficulty sleeping.   Crying spells, tearfulness.   Irritability.   Anxiety.  Symptoms of postpartum depression typically begin within the first month after giving birth. These symptoms include:  Difficulty sleeping or excessive sleepiness.   Marked weight loss.   Agitation.   Feelings of worthlessness.   Lack of interest in activity or food.  Postpartum psychosis is a very serious condition and can be dangerous. Fortunately, it is rare.  Displaying any of the following symptoms is cause for immediate medical attention. Symptoms of postpartum psychosis include:   Hallucinations and delusions.   Bizarre or disorganized behavior.   Confusion or disorientation.  DIAGNOSIS  A diagnosis is made by an evaluation of your symptoms. There are no medical or lab tests that lead to a diagnosis, but there are various questionnaires that a health care provider may use to identify those with the baby blues, postpartum depression, or psychosis. Often, a screening tool called the [place] Postnatal Depression Scale is used to diagnose depression in the postpartum period.  TREATMENT The baby blues usually goes away on its own in 1-2 weeks. Social support is often all that is needed. You will be encouraged to get adequate sleep and rest. Occasionally, you may be given medicines to help you sleep.  Postpartum depression requires treatment because it can last several months or longer if it is not treated. Treatment may include individual or group therapy, medicine, or both to address any social, physiological, and psychological factors that may play a role in the depression. Regular exercise, a healthy diet, rest, and social support may also be strongly recommended.  Postpartum psychosis is more serious and needs treatment right away. Hospitalization is often needed. HOME CARE INSTRUCTIONS  Get as much rest as you can. Nap when the baby sleeps.   Exercise regularly. Some women find yoga and walking to be beneficial.   Eat a balanced and nourishing diet.   Do little things that you enjoy. Have a cup of tea, take a bubble bath, read your favorite magazine, or listen to your favorite music.  Avoid alcohol.   Ask for help with household chores, cooking, grocery shopping, or running errands as needed. Do not try to do everything.   Talk to people close to you about how you are feeling. Get support from your partner, family members, friends,  or other new moms.  Try to stay positive in how you think. Think about the things you are grateful for.   Do not spend a lot of time alone.   Only take over-the-counter or prescription medicine as directed by your health care provider.  Keep all your postpartum appointments.   Let your health care provider know if you have any concerns.  SEEK MEDICAL CARE IF: You are having a reaction to or problems with your medicine. SEEK IMMEDIATE MEDICAL CARE IF:  You have suicidal feelings.   You think you may harm the baby or someone else. MAKE SURE YOU:  Understand these instructions.  Will watch your condition.  Will get help right away if you are not doing well or get worse.   This information is not intended to replace advice given to you by your health care provider. Make sure you discuss any questions you have with your health care  Document Released: 07/19/2004 Document Revised: 10/20/2013 Document Reviewed: 07/27/2013 Elsevier Interactive Patient Education [employer]. Norethindrone tablets (contraception) What is this medicine? NORETHINDRONE (nor eth IN drone) is an oral contraceptive. The product contains a female hormone known as a progestin. It is used to prevent pregnancy. This medicine may be used for other purposes; ask your health care provider or pharmacist if you have questions. What should I tell my health care provider before I take this medicine? They need to know if you have any of these conditions: -blood vessel disease or blood clots -breast, cervical, or vaginal cancer -diabetes -heart disease -kidney disease -liver disease -mental depression -migraine -seizures -stroke -vaginal bleeding -an unusual or allergic reaction to norethindrone, other medicines, foods, dyes, or preservatives -pregnant or trying to get pregnant -breast-feeding How should I use this medicine? Take this medicine by mouth with a glass of water. You may take  it with or without food. Follow the directions on the prescription label. Take this medicine at the same time each day and in the order directed on the package. Do not take your medicine more often than directed. Contact your pediatrician regarding the use of this medicine in children. Special care may be needed. This medicine has been used in female children who have started having menstrual periods. A patient package insert for the product will be given with each prescription and refill. Read this sheet carefully each time. The sheet may change frequently. Overdosage: If you think you have taken too much of this medicine contact a poison control center or emergency room at once. NOTE: This medicine is only for you. Do not share this medicine with others. What if I miss a dose? Try not to miss a dose. Every time you miss a dose or take a dose late your chance of pregnancy increases. When 1 pill is missed (even if only 3 hours late), take the missed pill as soon as possible and continue taking a pill each day at the regular time (use a back up method of birth control for the next 48 hours). If more than 1 dose is missed, use an additional birth control method for the rest of your pill pack until menses occurs. Contact your health care professional if more than 1 dose has been missed. What may interact with this medicine? Do not take this medicine with any of the following medications: -amprenavir or fosamprenavir -bosentan This medicine may also interact with the following medications: -antibiotics or medicines for infections, especially rifampin, rifabutin, rifapentine, and griseofulvin, and possibly penicillins or tetracyclines -aprepitant -barbiturate medicines, such as phenobarbital -carbamazepine -felbamate -modafinil -oxcarbazepine -phenytoin -ritonavir or other medicines for HIV infection or AIDS -St. John's wort -topiramate This list may not describe all possible interactions. Give  your health care provider a list of all the medicines, herbs, non-prescription drugs, or dietary supplements you use. Also tell them if you smoke, drink alcohol, or use illegal drugs. Some items may interact with your medicine. What should I watch for while using this medicine? Visit your doctor or health care professional for regular checks on your progress. You will need a regular breast and pelvic exam and Pap smear while on this medicine. Use an additional method of birth control during the first cycle that you take these tablets. If you have any reason to think you are pregnant, stop taking this medicine right away and contact your doctor or health care professional. If you are taking this medicine for hormone related problems, it may  take several cycles of use to see improvement in your condition. This medicine does not protect you against HIV infection (AIDS) or any other sexually transmitted diseases. What side effects may I notice from receiving this medicine? Side effects that you should report to your doctor or health care professional as soon as possible: -breast tenderness or discharge -pain in the abdomen, chest, groin or leg -severe headache -skin rash, itching, or hives -sudden shortness of breath -unusually weak or tired -vision or speech problems -yellowing of skin or eyes Side effects that usually do not require medical attention (report to your doctor or health care professional if they continue or are bothersome): -changes in sexual desire -change in menstrual flow -facial hair growth -fluid retention and swelling -headache -irritability -nausea -weight gain or loss This list may not describe all possible side effects. Call your doctor for medical advice about side effects. You may report side effects to FDA at 1-800-FDA-1088. Where should I keep my medicine? Keep out of the reach of children. Store at room temperature between 15 and 30 degrees C (59 and 86 degrees  F). Throw away any unused medicine after the expiration date. NOTE: This sheet is a summary. It may not cover all possible information. If you have questions about this medicine, talk to your doctor, pharmacist, or health care provider.    2016, Elsevier/Gold Standard. (2012-07-04 16:41:35)

## 2016-02-16 NOTE — Progress Notes (Signed)
Subjective: Postpartum Day 1: Vaginal delivery, No laceration Patient up ad lib, reports no syncope or dizziness. Feeding:  Breast Contraceptive plan:  Micronor  Objective: Vital signs in last 24 hours: Temp:  [97.5 F (36.4 C)-98.2 F (36.8 C)] 98 F (36.7 C) (04/20 0630) Pulse Rate:  [69-97] 77 (04/20 0630) Resp:  [15-18] 18 (04/20 0630) BP: (109-130)/(54-69) 109/62 mmHg (04/20 0630) SpO2:  [98 %] 98 % (04/20 0630)  Physical Exam:  General: alert and cooperative Lochia: appropriate Uterine Fundus: firm Perineum: healing well DVT Evaluation: No evidence of DVT seen on physical exam.   CBC Latest Ref Rng 02/16/2016 02/15/2016 08/17/2015  WBC 4.0 - 10.5 K/uL 13.0(H) 11.2(H) 9.1  Hemoglobin 12.0 - 15.0 g/dL 11.5(L) 13.8 13.6  Hematocrit 36.0 - 46.0 % 33.2(L) 38.9 39.3  Platelets 150 - 400 K/uL 208 263 285     Assessment/Plan: Status post vaginal delivery day 1. Stable Continue current care. Breastfeeding Desires to discharge today if baby is cleared for discharge later this afternoon   (infant watched for feeding/urine output)    Beatrix Fettersachel StallCNM 02/16/2016, 9:06 AM

## 2017-06-26 IMAGING — US US OB COMP LESS 14 WK
1 series · 14 of 28 positions shown · non-contrast
Comparison: None.

CLINICAL DATA: Syncope. Dizziness and lightheadedness this morning.
Early pregnancy.

EXAM:
OBSTETRIC <14 WK ULTRASOUND
TECHNIQUE: Transabdominal ultrasound was performed for evaluation of the
gestation as well as the maternal uterus and adnexal regions.

[Series 1: us ob comp less 14 wk · 0.24mm/px · 14 of 33 slices shown]
[im 2/33]
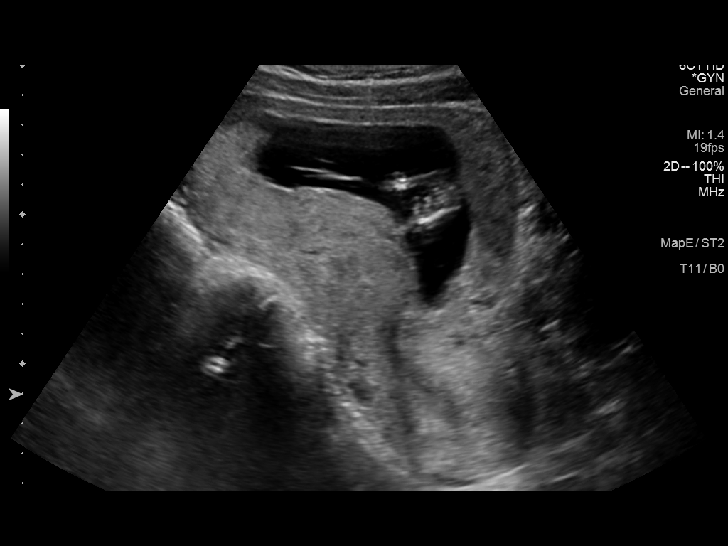
[im 4/33]
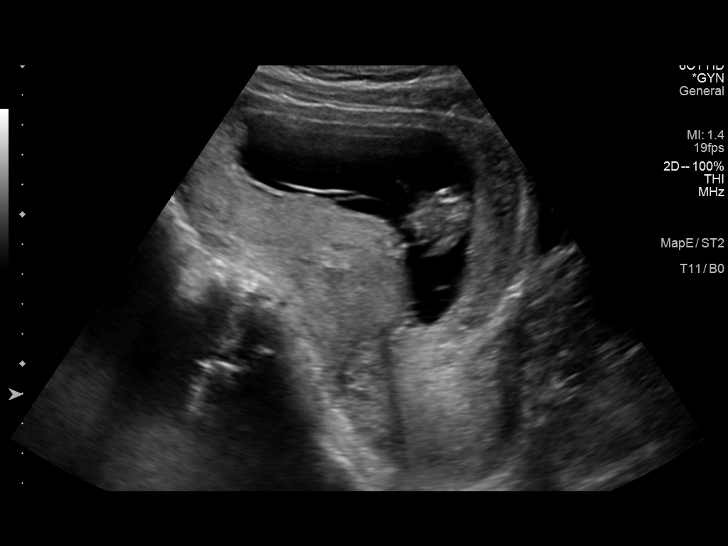
[im 6/33]
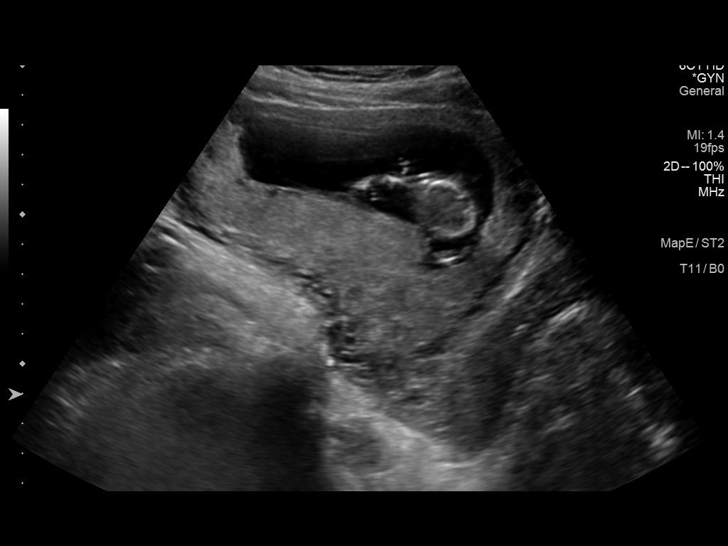
[im 9/33]
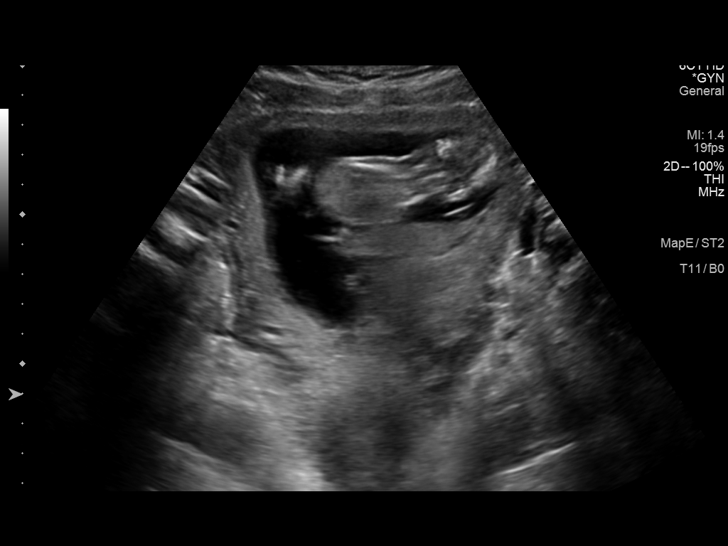
[im 11/33]
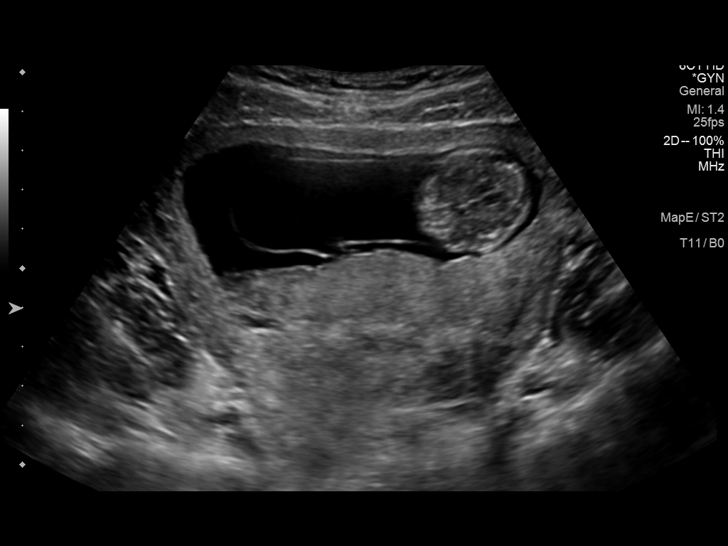
[im 14/33]
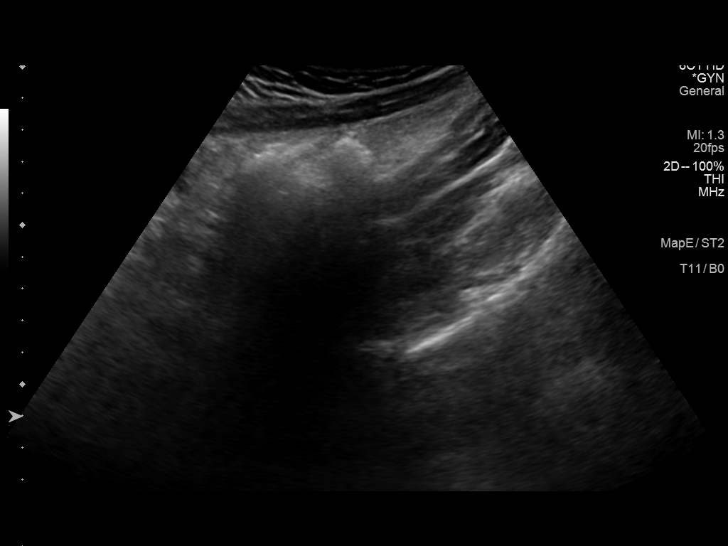
[im 16/33]
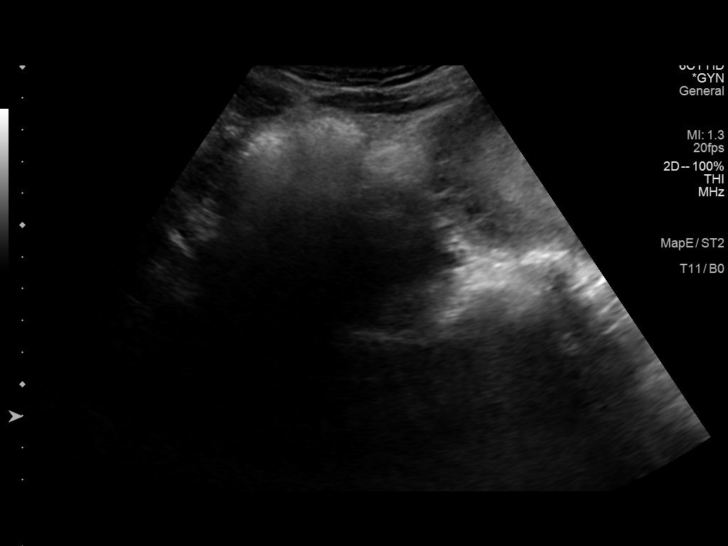
[im 18/33]
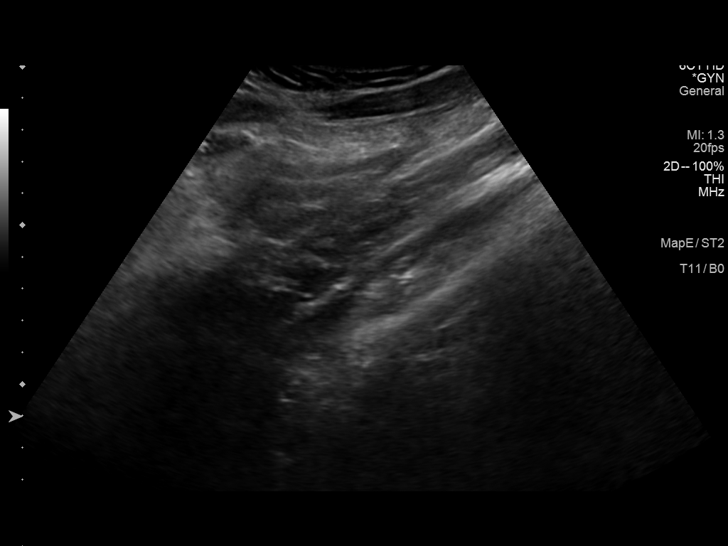
[im 21/33]
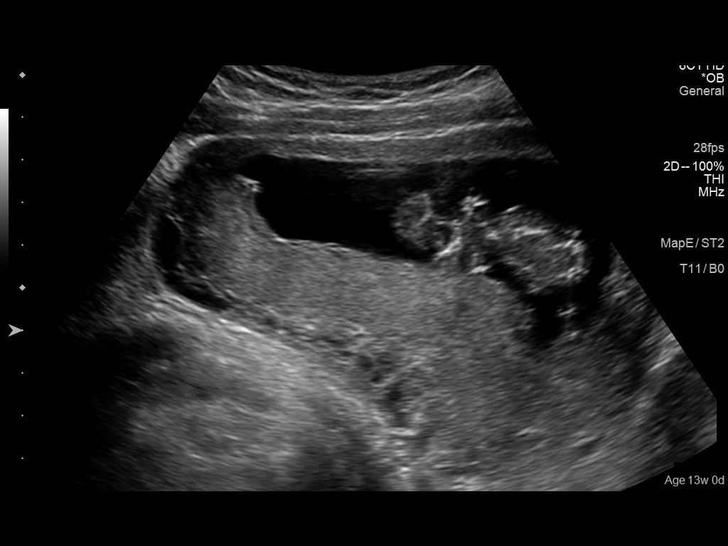
[im 23/33]
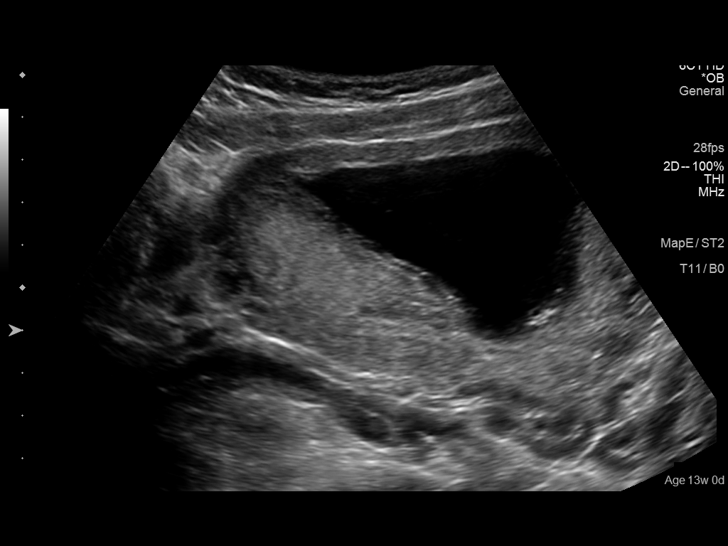
[im 25/33]
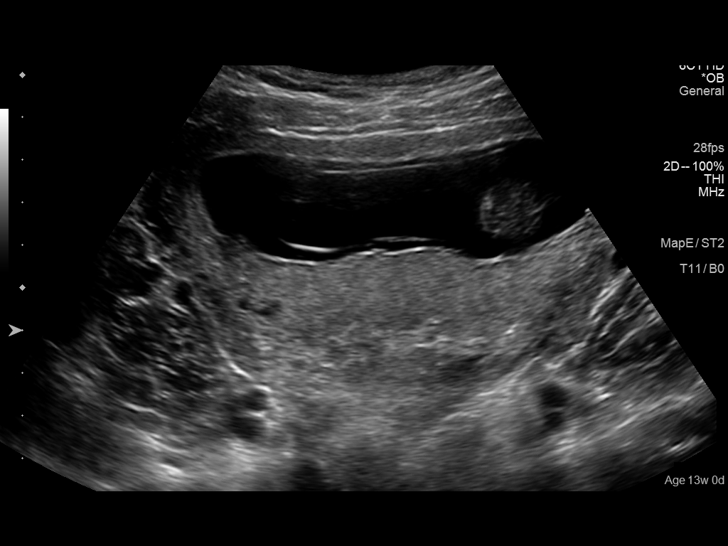
[im 28/33]
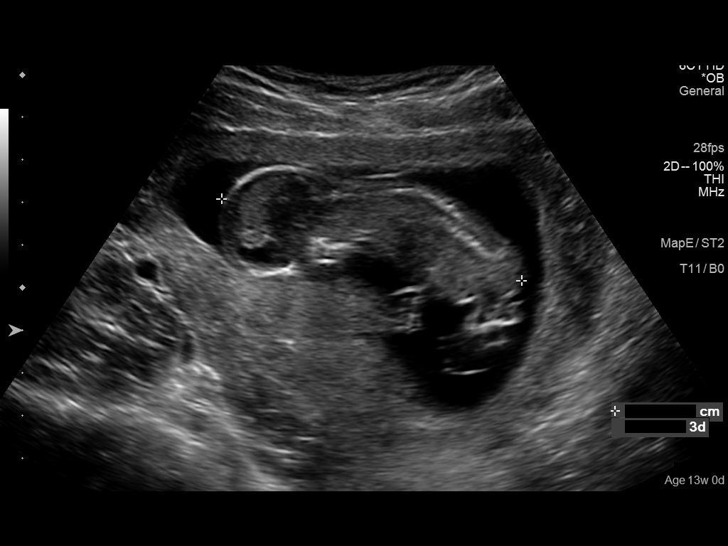
[im 30/33]
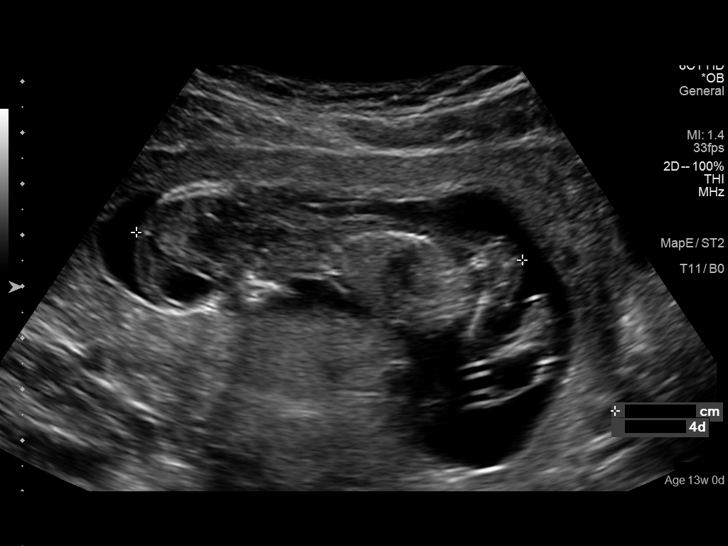
[im 33/33]
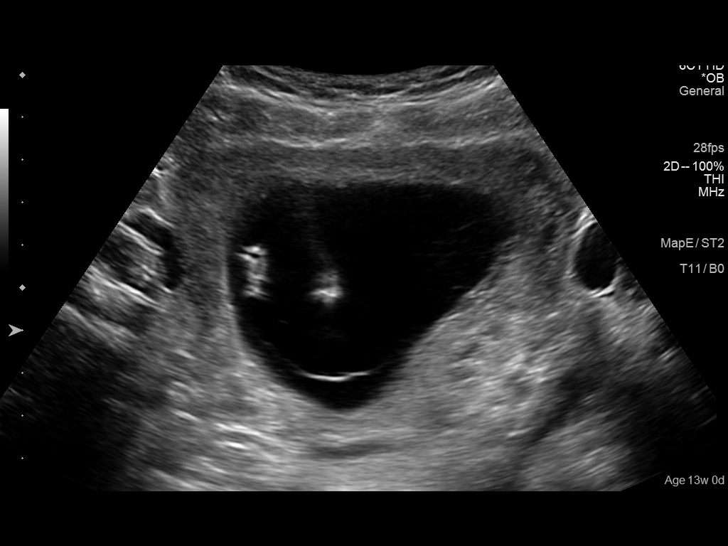

[14 of 28 positions shown; findings below may reference images not displayed]

FINDINGS: Intrauterine gestational sac: Visualized/normal in shape.

Yolk sac:  No longer visualized.

Embryo:  Present.

Cardiac Activity: Present.

Heart Rate: 152 bpm

CRL:   75  mm   13 w 4 d                  US EDC: 02/18/2016

Maternal uterus/adnexae: The ovaries not identified. No free fluid
is seen in the pelvis.
IMPRESSION: Single living intrauterine gestation as above. Gestational age 13
weeks 4 days by ultrasound.

## 2021-01-19 ENCOUNTER — Other Ambulatory Visit: Payer: Self-pay | Admitting: Obstetrics & Gynecology

## 2021-01-19 DIAGNOSIS — Z363 Encounter for antenatal screening for malformations: Secondary | ICD-10-CM

## 2021-02-16 ENCOUNTER — Ambulatory Visit: Payer: 59

## 2021-02-16 ENCOUNTER — Other Ambulatory Visit: Payer: 59

## 2021-02-22 ENCOUNTER — Other Ambulatory Visit: Payer: Self-pay | Admitting: Obstetrics and Gynecology

## 2021-02-22 DIAGNOSIS — O35BXX0 Maternal care for other (suspected) fetal abnormality and damage, fetal cardiac anomalies, not applicable or unspecified: Secondary | ICD-10-CM

## 2021-02-22 DIAGNOSIS — O358XX0 Maternal care for other (suspected) fetal abnormality and damage, not applicable or unspecified: Secondary | ICD-10-CM

## 2021-02-22 DIAGNOSIS — Z363 Encounter for antenatal screening for malformations: Secondary | ICD-10-CM

## 2021-02-22 DIAGNOSIS — Z3A36 36 weeks gestation of pregnancy: Secondary | ICD-10-CM

## 2021-03-03 ENCOUNTER — Ambulatory Visit: Payer: 59 | Admitting: *Deleted

## 2021-03-03 ENCOUNTER — Other Ambulatory Visit: Payer: Self-pay

## 2021-03-03 ENCOUNTER — Ambulatory Visit: Payer: 59 | Attending: Obstetrics and Gynecology

## 2021-03-03 ENCOUNTER — Encounter: Payer: Self-pay | Admitting: *Deleted

## 2021-03-03 VITALS — BP 120/71 | HR 91

## 2021-03-03 DIAGNOSIS — O35BXX0 Maternal care for other (suspected) fetal abnormality and damage, fetal cardiac anomalies, not applicable or unspecified: Secondary | ICD-10-CM

## 2021-03-03 DIAGNOSIS — Z363 Encounter for antenatal screening for malformations: Secondary | ICD-10-CM | POA: Insufficient documentation

## 2021-03-03 DIAGNOSIS — O358XX0 Maternal care for other (suspected) fetal abnormality and damage, not applicable or unspecified: Secondary | ICD-10-CM | POA: Diagnosis present

## 2021-03-03 DIAGNOSIS — Z3A36 36 weeks gestation of pregnancy: Secondary | ICD-10-CM | POA: Diagnosis present

## 2023-01-11 IMAGING — US US MFM OB DETAIL+14 WK
1 series · 13 of 28 positions shown · non-contrast
Comparison: none

[Series 1: us mfm ob detail+14 wk · 71 acquisitions, 13 frames shown]
[im 3/71]
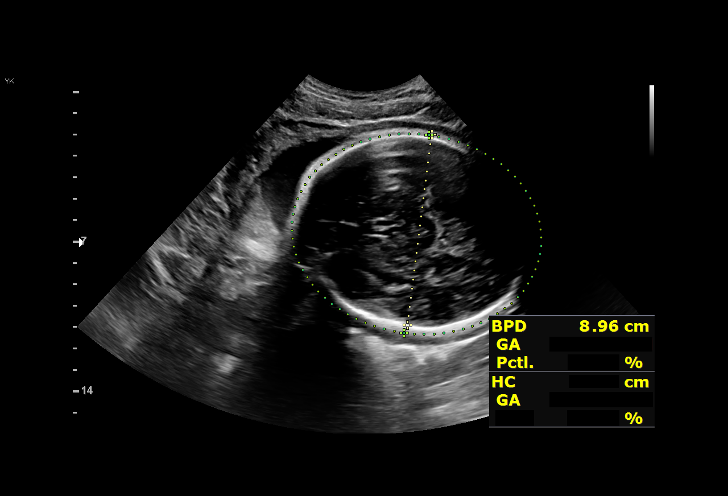
[im 8/71]
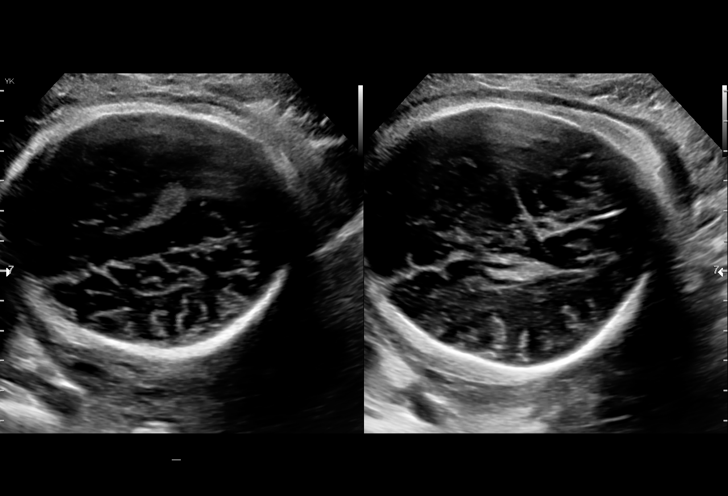
[im 13/71]
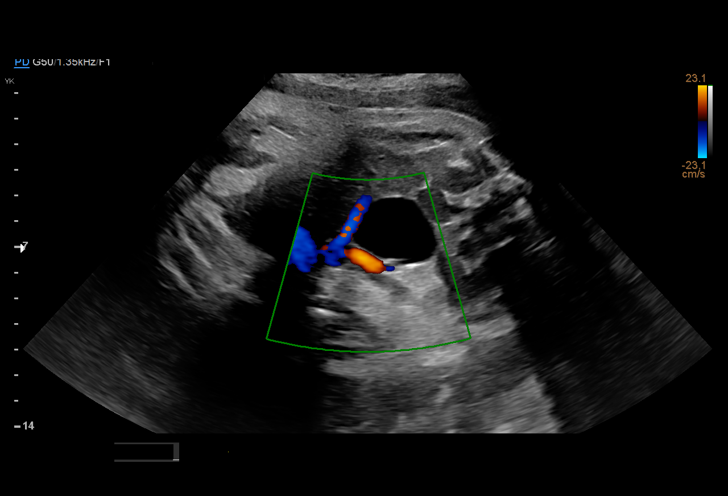
[im 19/71]
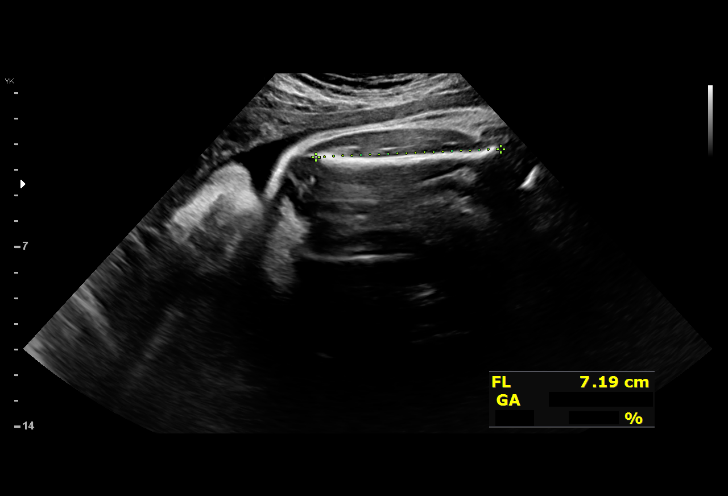
[im 24/71]
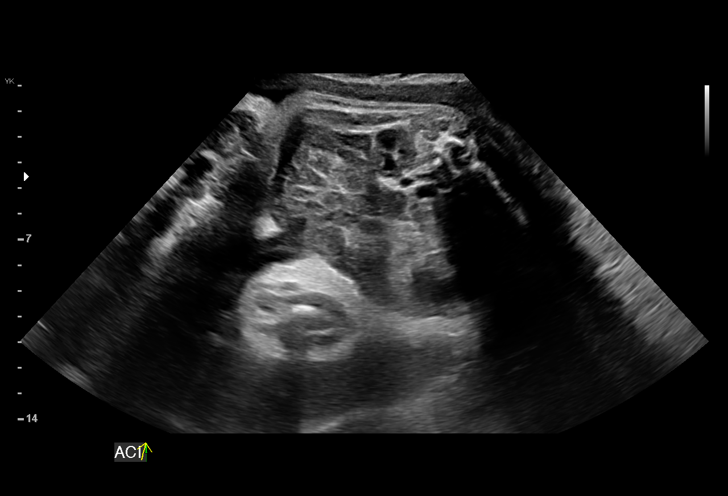
[im 29/71]
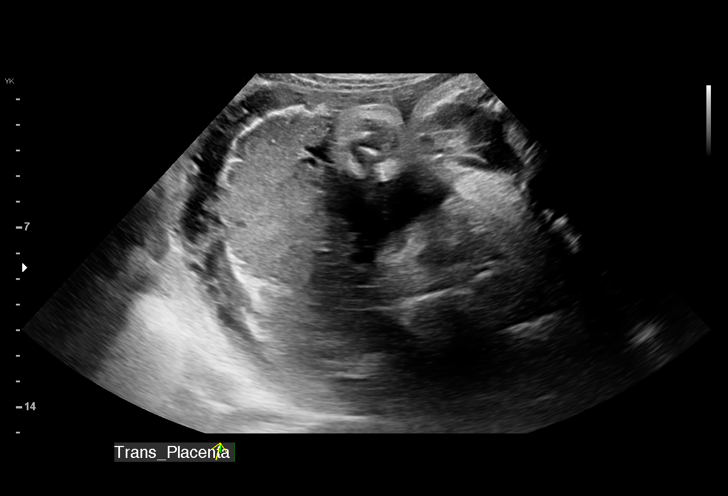
[im 37/71]
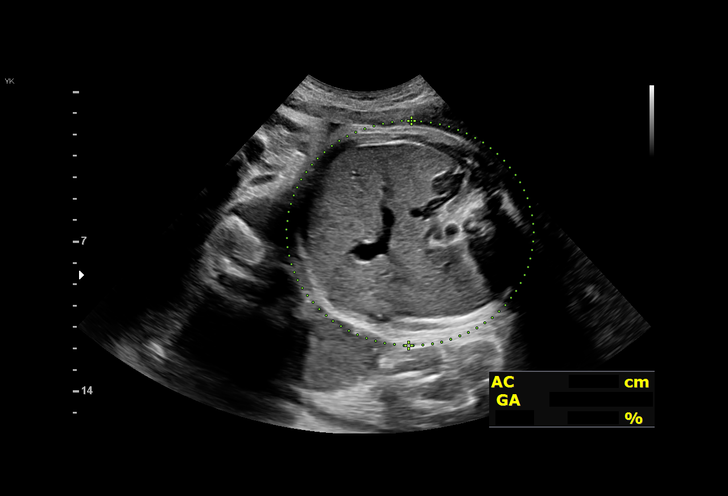
[im 42/71]
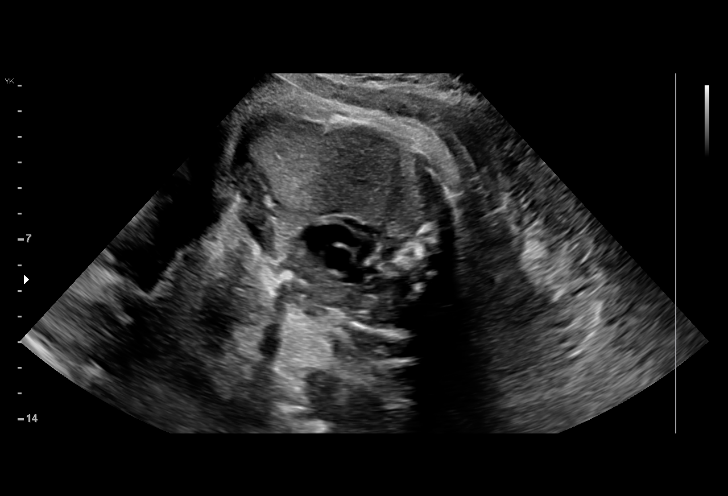
[im 47/71]
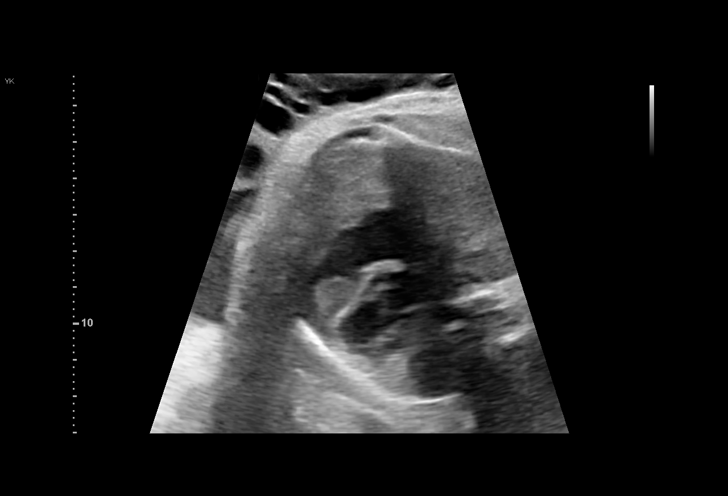
[im 52/71]
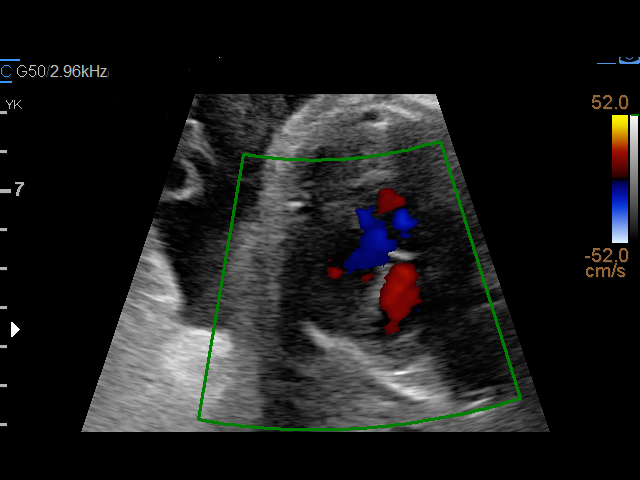
[im 58/71]
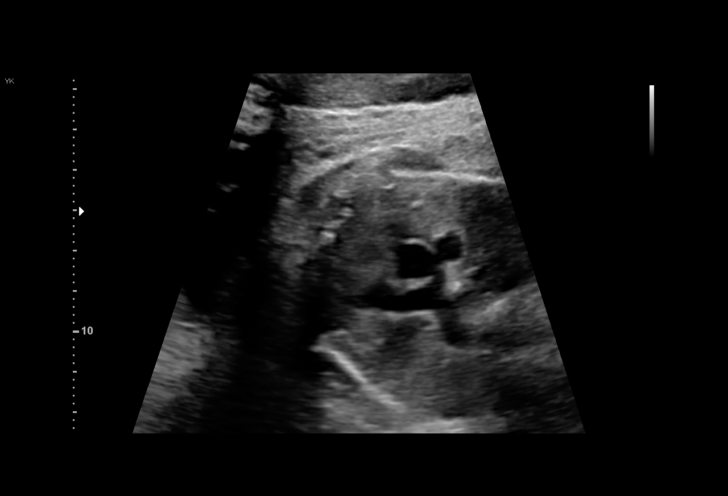
[im 63/71]
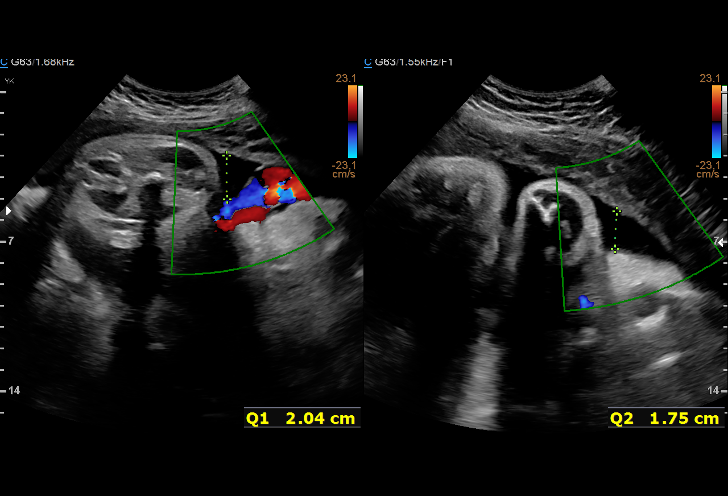
[im 68/71]
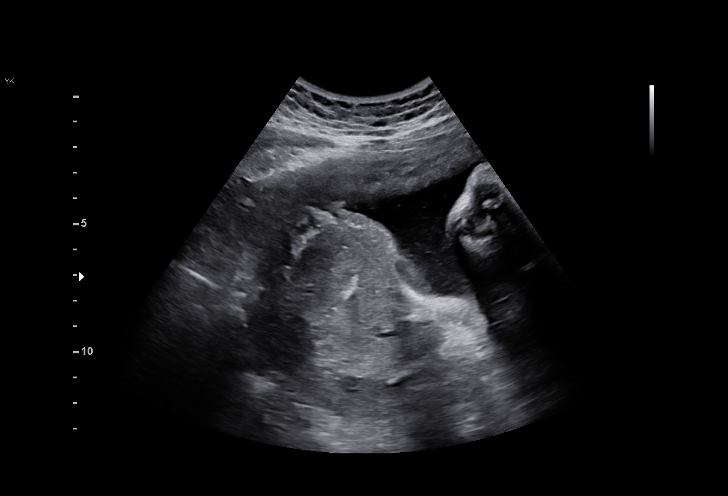

[13 of 28 positions shown; findings below may reference images not displayed]

Obstetrics &
                                                            Gynecology
                                                            [id number]
                                                            Brise[place].
                   CNM

Indications

 Fetal cardiac anomaly affecting pregnancy,
 antepartum
 Encounter for congenital cardiac
 abnormalities
 Abnormal ultrasound finding on antenatal
 screening of mother, Tetralogy of Abad
 36 weeks gestation of pregnancy
Fetal Evaluation

 Num Of Fetuses:         1
 Fetal Heart Rate(bpm):  140
 Cardiac Activity:       Observed
 Presentation:           Cephalic
 Placenta:               Posterior Fundal

 Amniotic Fluid
 AFI FV:      Within normal limits

 AFI Sum(cm)     %Tile       Largest Pocket(cm)
 12.8            44

 RUQ(cm)       RLQ(cm)       LUQ(cm)        LLQ(cm)
 2
Biometry

 BPD:      88.5  mm     G. Age:  35w 5d         36  %    CI:        72.16   %    70 - 86
                                                         FL/HC:      21.2   %    20.8 -
 HC:      331.5  mm     G. Age:  37w 6d         49  %    HC/AC:      0.96        0.92 -
 AC:      343.8  mm     G. Age:  38w 2d         93  %    FL/BPD:     79.4   %    71 - 87
 FL:       70.3  mm     G. Age:  36w 0d         30  %    FL/AC:      20.4   %    20 - 24

 Est. FW:    8989  gm      7 lb 1 oz     73  %
OB History

 Gravidity:    3         Term:   2
 Living:       2
Gestational Age

 LMP:           36w 5d        Date:  06/19/20                 EDD:   03/26/21
 U/S Today:     37w 0d                                        EDD:   03/24/21
 Best:          36w 5d     Det. By:  LMP  (06/19/20)          EDD:   03/26/21
Anatomy

 Cranium:               Appears normal         Aortic Arch:            Not well visualized
 Cavum:                 Appears normal         Ductal Arch:            Not well visualized
 Ventricles:            Appears normal         Diaphragm:              Appears normal
 Choroid Plexus:        Appears normal         Stomach:                Appears normal, left
                                                                       sided
 Cerebellum:            Appears normal         Abdomen:                Appears normal
 Posterior Fossa:       Appears normal         Abdominal Wall:         Appears nml (cord
                                                                       insert, abd wall)
 Nuchal Fold:           Not applicable (>20    Cord Vessels:           Appears normal (3
                        wks GA)                                        vessel cord)
 Face:                  Not well visualized    Kidneys:                Appear normal
 Lips:                  Appears normal         Bladder:                Appears normal
 Thoracic:              Appears normal         Spine:                  Not well visualized
 Heart:                 Abnormal, see          Upper Extremities:      Not well visualized
                        comments
 RVOT:                  Abnormal, see          Lower Extremities:      Visualized
                        comments
 LVOT:                  Abnormal, see
                        comments

 Other:  Technically difficult due to maternal habitus and fetal position.
Comments

 This patient was seen for an an ultrasound exam as the fetus
 has been diagnosed with a Tetralogy of Abad based on her
 fetal echocardiogram performed with [HOSPITAL] pediatric
 cardiology.  The patient is already scheduled for delivery at
 [HOSPITAL] [facility] on March 20, 2021 so that her baby can
 receive the appropriate subspecialist care after delivery.  She
 denies any problems in her current pregnancy.  She is having
 weekly nonstress tests performed in your office.
 She was informed that the fetal growth and amniotic fluid
 level appears appropriate for her gestational age.
 The views of the fetal anatomy were limited today due to her
 advanced gestational age.
 Due to a fetus with a congenital heart defect, she should
 continue weekly fetal testing in your office until delivery.
 No further exams were scheduled in our office.

## 2024-11-11 ENCOUNTER — Encounter (HOSPITAL_BASED_OUTPATIENT_CLINIC_OR_DEPARTMENT_OTHER): Payer: Self-pay | Admitting: Emergency Medicine

## 2024-11-11 ENCOUNTER — Emergency Department (HOSPITAL_BASED_OUTPATIENT_CLINIC_OR_DEPARTMENT_OTHER)
Admission: EM | Admit: 2024-11-11 | Discharge: 2024-11-11 | Disposition: A | Discharge status: Home / Self Care | Attending: Emergency Medicine | Admitting: Emergency Medicine

## 2024-11-11 ENCOUNTER — Other Ambulatory Visit: Payer: Self-pay

## 2024-11-11 DIAGNOSIS — H6992 Unspecified Eustachian tube disorder, left ear: Secondary | ICD-10-CM | POA: Diagnosis not present

## 2024-11-11 DIAGNOSIS — J029 Acute pharyngitis, unspecified: Secondary | ICD-10-CM | POA: Insufficient documentation

## 2024-11-11 LAB — GROUP A STREP BY PCR: Group A Strep by PCR: NOT DETECTED

## 2024-11-11 NOTE — ED Provider Notes (Signed)
 "  [name]  Provider Note  CSN: [id number] Arrival date & time: 11/11/24 0441  History Chief Complaint  Patient presents with   Sore Throat    Linda Norman is a 34 y.o. female reports sore throat for the last few days, not associated with fever and improved with motrin  but tonight was moving into her left ear. She reports kids at home were sick last week.    Home Medications Prior to Admission medications  Medication Sig Start Date End Date Taking? Authorizing Provider  ibuprofen  (ADVIL ,MOTRIN ) 600 MG tablet Take 1 tablet (600 mg total) by mouth every 6 (six) hours as needed. Patient not taking: Reported on 03/03/2021 02/16/16   Richie Millman, CNM  norethindrone  (ORTHO MICRONOR ) 0.35 MG tablet Take 1 tablet (0.35 mg total) by mouth daily. Patient not taking: Reported on 03/03/2021 03/08/16   Richie Millman, CNM  Prenatal Vit-Fe Fumarate-FA (PRENATAL MULTIVITAMIN) TABS tablet Take 1 tablet by mouth daily at 12 noon.    [provider]     Allergies    Patient has no known allergies.   Review of Systems   Review of Systems Please see HPI for pertinent positives and negatives  Physical Exam BP (!) 141/87 (BP Location: Right Arm)   Pulse 77   Temp 97.8 F (36.6 C) (Oral)   Resp 20   Ht 5' 7 (1.702 m)   Wt 104.3 kg   LMP 10/12/2024 (Approximate)   SpO2 100%   BMI 36.02 kg/m   Physical Exam Vitals and nursing note reviewed.  Constitutional:      Appearance: Normal appearance.  HENT:     Head: Normocephalic and atraumatic.     Right Ear: Tympanic membrane normal.     Left Ear: Tympanic membrane normal.     Nose: Nose normal. No rhinorrhea.     Mouth/Throat:     Mouth: Mucous membranes are moist.     Pharynx: No oropharyngeal exudate or posterior oropharyngeal erythema.     Tonsils: No tonsillar exudate or tonsillar abscesses.  Eyes:     Extraocular Movements: Extraocular movements intact.      Conjunctiva/sclera: Conjunctivae normal.  Cardiovascular:     Rate and Rhythm: Normal rate.  Pulmonary:     Effort: Pulmonary effort is normal.     Breath sounds: Normal breath sounds.  Abdominal:     General: Abdomen is flat.     Palpations: Abdomen is soft.     Tenderness: There is no abdominal tenderness.  Musculoskeletal:        General: No swelling. Normal range of motion.     Cervical back: Neck supple.  Lymphadenopathy:     Cervical: No cervical adenopathy.  Skin:    General: Skin is warm and dry.  Neurological:     General: No focal deficit present.     Mental Status: She is alert.  Psychiatric:        Mood and Affect: Mood normal.     ED Results / Procedures / Treatments   EKG None  Procedures Procedures  Medications Ordered in the ED Medications - No data to display  Initial Impression and Plan  Patient here with sore throat and ear pain. Vitals and exam are reassuring, suspect viral process, but will check strep given her specific concerns for same.   ED Course   Clinical Course as of 11/11/24 0525  Wed Nov 11, 2024  [id number] Strep is negative. As above, suspect a  viral source. Recommend she continue OTC analgesia and/or decongestants to manage symptoms. PCP follow up, RTED for any other concerns.   [CS]    Clinical Course User Index [CS] Roselyn Carlin NOVAK, MD     MDM Rules/Calculators/A&P Medical Decision Making Problems Addressed: Eustachian tube disorder, left: acute illness or injury Viral pharyngitis: acute illness or injury  Amount and/or Complexity of Data Reviewed Labs: ordered. Decision-making details documented in ED Course.     Final Clinical Impression(s) / ED Diagnoses Final diagnoses:  Viral pharyngitis  Eustachian tube disorder, left    Rx / DC Orders ED Discharge Orders     None        Roselyn Carlin NOVAK, MD 11/11/24 [phone]  "

## 2024-11-11 NOTE — ED Triage Notes (Signed)
 Left side throat swelling and pain starting Saturday. Radiates to ear and left side face.
